# Patient Record
Sex: Female | Born: 1945 | Race: White | Hispanic: No | Smoking: Never smoker
Health system: Southern US, Community
[De-identification: ages and names within clinical notes are randomized; demographics above are authoritative.]

## PROBLEM LIST (undated history)

## (undated) DIAGNOSIS — M1990 Unspecified osteoarthritis, unspecified site: Secondary | ICD-10-CM

## (undated) DIAGNOSIS — I10 Essential (primary) hypertension: Secondary | ICD-10-CM

## (undated) DIAGNOSIS — S060X9A Concussion with loss of consciousness of unspecified duration, initial encounter: Secondary | ICD-10-CM

## (undated) DIAGNOSIS — I5189 Other ill-defined heart diseases: Secondary | ICD-10-CM

## (undated) DIAGNOSIS — F329 Major depressive disorder, single episode, unspecified: Secondary | ICD-10-CM

## (undated) DIAGNOSIS — R739 Hyperglycemia, unspecified: Secondary | ICD-10-CM

## (undated) DIAGNOSIS — F32A Depression, unspecified: Secondary | ICD-10-CM

## (undated) DIAGNOSIS — S060XAA Concussion with loss of consciousness status unknown, initial encounter: Secondary | ICD-10-CM

## (undated) DIAGNOSIS — R5381 Other malaise: Secondary | ICD-10-CM

## (undated) DIAGNOSIS — K5792 Diverticulitis of intestine, part unspecified, without perforation or abscess without bleeding: Secondary | ICD-10-CM

## (undated) DIAGNOSIS — K219 Gastro-esophageal reflux disease without esophagitis: Secondary | ICD-10-CM

## (undated) DIAGNOSIS — R0789 Other chest pain: Secondary | ICD-10-CM

## (undated) DIAGNOSIS — Z8619 Personal history of other infectious and parasitic diseases: Secondary | ICD-10-CM

## (undated) DIAGNOSIS — I499 Cardiac arrhythmia, unspecified: Secondary | ICD-10-CM

## (undated) DIAGNOSIS — E119 Type 2 diabetes mellitus without complications: Secondary | ICD-10-CM

## (undated) HISTORY — DX: Major depressive disorder, single episode, unspecified: F32.9

## (undated) HISTORY — DX: Depression, unspecified: F32.A

## (undated) HISTORY — PX: BREAST SURGERY: SHX581

## (undated) HISTORY — DX: Essential (primary) hypertension: I10

## (undated) HISTORY — DX: Personal history of other infectious and parasitic diseases: Z86.19

## (undated) HISTORY — DX: Hyperglycemia, unspecified: R73.9

## (undated) HISTORY — DX: Other ill-defined heart diseases: I51.89

## (undated) HISTORY — PX: BREAST EXCISIONAL BIOPSY: SUR124

## (undated) HISTORY — PX: TOE SURGERY: SHX1073

## (undated) HISTORY — PX: ROOT CANAL: SHX2363

## (undated) HISTORY — PX: ABDOMINAL HYSTERECTOMY: SHX81

## (undated) HISTORY — PX: CHOLECYSTECTOMY: SHX55

## (undated) HISTORY — DX: Type 2 diabetes mellitus without complications: E11.9

## (undated) HISTORY — DX: Concussion with loss of consciousness of unspecified duration, initial encounter: S06.0X9A

## (undated) HISTORY — DX: Diverticulitis of intestine, part unspecified, without perforation or abscess without bleeding: K57.92

## (undated) HISTORY — PX: CATARACT EXTRACTION, BILATERAL: SHX1313

## (undated) HISTORY — DX: Concussion with loss of consciousness status unknown, initial encounter: S06.0XAA

## (undated) HISTORY — DX: Other chest pain: R07.89

## (undated) HISTORY — DX: Gastro-esophageal reflux disease without esophagitis: K21.9

## (undated) HISTORY — DX: Other malaise: R53.81

---

## 1995-08-20 HISTORY — PX: BLADDER SUSPENSION: SHX72

## 1998-04-19 ENCOUNTER — Other Ambulatory Visit: Admission: RE | Admit: 1998-04-19 | Discharge: 1998-04-19 | Payer: Self-pay | Admitting: *Deleted

## 1998-10-19 ENCOUNTER — Ambulatory Visit (HOSPITAL_BASED_OUTPATIENT_CLINIC_OR_DEPARTMENT_OTHER): Admission: RE | Admit: 1998-10-19 | Discharge: 1998-10-19 | Payer: Self-pay | Admitting: *Deleted

## 1999-08-01 ENCOUNTER — Other Ambulatory Visit: Admission: RE | Admit: 1999-08-01 | Discharge: 1999-08-01 | Payer: Self-pay | Admitting: *Deleted

## 2000-04-28 ENCOUNTER — Encounter: Payer: Self-pay | Admitting: Gastroenterology

## 2000-05-12 ENCOUNTER — Encounter (INDEPENDENT_AMBULATORY_CARE_PROVIDER_SITE_OTHER): Payer: Self-pay | Admitting: Specialist

## 2000-05-12 ENCOUNTER — Encounter: Payer: Self-pay | Admitting: Surgery

## 2000-05-12 ENCOUNTER — Ambulatory Visit (HOSPITAL_COMMUNITY): Admission: RE | Admit: 2000-05-12 | Discharge: 2000-05-13 | Payer: Self-pay | Admitting: Surgery

## 2001-07-07 ENCOUNTER — Other Ambulatory Visit: Admission: RE | Admit: 2001-07-07 | Discharge: 2001-07-07 | Payer: Self-pay | Admitting: *Deleted

## 2001-07-07 ENCOUNTER — Encounter: Admission: RE | Admit: 2001-07-07 | Discharge: 2001-07-07 | Payer: Self-pay

## 2002-07-30 ENCOUNTER — Encounter: Admission: RE | Admit: 2002-07-30 | Discharge: 2002-07-30 | Payer: Self-pay | Admitting: *Deleted

## 2002-11-04 ENCOUNTER — Ambulatory Visit (HOSPITAL_COMMUNITY): Admission: RE | Admit: 2002-11-04 | Discharge: 2002-11-04 | Payer: Self-pay

## 2003-09-27 ENCOUNTER — Other Ambulatory Visit: Admission: RE | Admit: 2003-09-27 | Discharge: 2003-09-27 | Payer: Self-pay | Admitting: *Deleted

## 2004-08-24 ENCOUNTER — Ambulatory Visit (HOSPITAL_COMMUNITY): Admission: RE | Admit: 2004-08-24 | Discharge: 2004-08-24 | Payer: Self-pay | Admitting: *Deleted

## 2004-08-29 ENCOUNTER — Ambulatory Visit: Payer: Self-pay | Admitting: Gastroenterology

## 2004-09-10 ENCOUNTER — Ambulatory Visit: Payer: Self-pay | Admitting: Gastroenterology

## 2004-11-29 ENCOUNTER — Ambulatory Visit: Payer: Self-pay

## 2005-04-18 ENCOUNTER — Ambulatory Visit: Payer: Self-pay | Admitting: Gastroenterology

## 2005-05-21 ENCOUNTER — Ambulatory Visit: Payer: Self-pay | Admitting: Gastroenterology

## 2005-12-17 ENCOUNTER — Encounter: Admission: RE | Admit: 2005-12-17 | Discharge: 2005-12-17 | Payer: Self-pay | Admitting: *Deleted

## 2007-01-22 ENCOUNTER — Ambulatory Visit (HOSPITAL_COMMUNITY): Admission: RE | Admit: 2007-01-22 | Discharge: 2007-01-22 | Payer: Self-pay | Admitting: Internal Medicine

## 2007-05-15 ENCOUNTER — Ambulatory Visit: Payer: Self-pay | Admitting: Internal Medicine

## 2007-12-15 ENCOUNTER — Ambulatory Visit: Payer: Self-pay | Admitting: Gastroenterology

## 2007-12-15 DIAGNOSIS — K589 Irritable bowel syndrome without diarrhea: Secondary | ICD-10-CM | POA: Insufficient documentation

## 2007-12-15 DIAGNOSIS — K2100 Gastro-esophageal reflux disease with esophagitis, without bleeding: Secondary | ICD-10-CM | POA: Insufficient documentation

## 2007-12-15 DIAGNOSIS — R1032 Left lower quadrant pain: Secondary | ICD-10-CM | POA: Insufficient documentation

## 2007-12-15 LAB — CONVERTED CEMR LAB
BUN: 8 mg/dL (ref 6–23)
Calcium: 9.7 mg/dL (ref 8.4–10.5)
Chloride: 104 meq/L (ref 96–112)
Creatinine, Ser: 1 mg/dL (ref 0.4–1.2)
Eosinophils Relative: 2.1 % (ref 0.0–5.0)
GFR calc non Af Amer: 60 mL/min
Glucose, Bld: 152 mg/dL — ABNORMAL HIGH (ref 70–99)
Hemoglobin: 14 g/dL (ref 12.0–15.0)
MCHC: 32.9 g/dL (ref 30.0–36.0)
Monocytes Absolute: 0.3 10*3/uL (ref 0.1–1.0)
Monocytes Relative: 4.7 % (ref 3.0–12.0)
Platelets: 376 10*3/uL (ref 150–400)
RDW: 13 % (ref 11.5–14.6)
Sed Rate: 22 mm/hr (ref 0–22)
TSH: 1.14 microintl units/mL (ref 0.35–5.50)
Total Bilirubin: 0.6 mg/dL (ref 0.3–1.2)
WBC: 6.4 10*3/uL (ref 4.5–10.5)

## 2007-12-21 ENCOUNTER — Telehealth: Payer: Self-pay | Admitting: Gastroenterology

## 2007-12-29 ENCOUNTER — Ambulatory Visit: Payer: Self-pay | Admitting: Gastroenterology

## 2007-12-29 DIAGNOSIS — I10 Essential (primary) hypertension: Secondary | ICD-10-CM | POA: Insufficient documentation

## 2007-12-29 DIAGNOSIS — F4323 Adjustment disorder with mixed anxiety and depressed mood: Secondary | ICD-10-CM | POA: Insufficient documentation

## 2008-01-10 ENCOUNTER — Telehealth: Payer: Self-pay | Admitting: Gastroenterology

## 2008-01-13 ENCOUNTER — Ambulatory Visit: Payer: Self-pay | Admitting: Internal Medicine

## 2008-01-14 ENCOUNTER — Telehealth: Payer: Self-pay | Admitting: Gastroenterology

## 2008-01-15 ENCOUNTER — Ambulatory Visit: Payer: Self-pay | Admitting: Gastroenterology

## 2008-01-15 ENCOUNTER — Encounter: Payer: Self-pay | Admitting: Gastroenterology

## 2008-01-22 ENCOUNTER — Encounter: Payer: Self-pay | Admitting: Gastroenterology

## 2008-01-25 ENCOUNTER — Encounter (INDEPENDENT_AMBULATORY_CARE_PROVIDER_SITE_OTHER): Payer: Self-pay | Admitting: *Deleted

## 2008-01-26 ENCOUNTER — Ambulatory Visit (HOSPITAL_COMMUNITY): Admission: RE | Admit: 2008-01-26 | Discharge: 2008-01-26 | Payer: Self-pay | Admitting: Internal Medicine

## 2008-01-26 ENCOUNTER — Telehealth: Payer: Self-pay | Admitting: Gastroenterology

## 2008-02-02 ENCOUNTER — Telehealth: Payer: Self-pay | Admitting: Gastroenterology

## 2008-04-28 ENCOUNTER — Ambulatory Visit: Payer: Self-pay | Admitting: Internal Medicine

## 2008-06-29 ENCOUNTER — Ambulatory Visit: Payer: Self-pay | Admitting: Internal Medicine

## 2009-02-17 ENCOUNTER — Ambulatory Visit (HOSPITAL_COMMUNITY): Admission: RE | Admit: 2009-02-17 | Discharge: 2009-02-17 | Payer: Self-pay | Admitting: Internal Medicine

## 2009-08-01 ENCOUNTER — Encounter (INDEPENDENT_AMBULATORY_CARE_PROVIDER_SITE_OTHER): Payer: Self-pay | Admitting: *Deleted

## 2009-08-31 ENCOUNTER — Ambulatory Visit: Payer: Self-pay | Admitting: Internal Medicine

## 2010-03-21 LAB — HM COLONOSCOPY

## 2010-04-04 ENCOUNTER — Telehealth: Payer: Self-pay | Admitting: Gastroenterology

## 2010-04-16 ENCOUNTER — Ambulatory Visit (HOSPITAL_COMMUNITY): Admission: RE | Admit: 2010-04-16 | Discharge: 2010-04-16 | Payer: Self-pay | Admitting: Internal Medicine

## 2010-09-20 NOTE — Progress Notes (Signed)
Summary: Colonoscopy recall project  ---- Converted from flag ---- ---- 04/04/2010 8:39 AM, Mardella Layman MD [facility] wrote: colon not needed  ---- 04/03/2010 10:52 AM, Harlow Mares CMA (AAMA) wrote: this is part of the GI recall project  you reviewed the paper chart and said patient was due for recall in 08/2009, but 01/2008 on the pathology you said no follow up needed, is recall needed? ------------------------------

## 2011-03-22 LAB — HM PAP SMEAR: HM PAP: NORMAL

## 2011-03-25 ENCOUNTER — Other Ambulatory Visit (HOSPITAL_COMMUNITY): Payer: Self-pay | Admitting: Internal Medicine

## 2011-03-25 DIAGNOSIS — Z1231 Encounter for screening mammogram for malignant neoplasm of breast: Secondary | ICD-10-CM

## 2011-04-18 ENCOUNTER — Ambulatory Visit (HOSPITAL_COMMUNITY)
Admission: RE | Admit: 2011-04-18 | Discharge: 2011-04-18 | Disposition: A | Discharge status: Home / Self Care | Payer: BC Managed Care – PPO | Source: Ambulatory Visit | Attending: Internal Medicine | Admitting: Internal Medicine

## 2011-04-18 DIAGNOSIS — Z1231 Encounter for screening mammogram for malignant neoplasm of breast: Secondary | ICD-10-CM

## 2011-05-17 ENCOUNTER — Ambulatory Visit (HOSPITAL_COMMUNITY)
Admission: RE | Admit: 2011-05-17 | Discharge: 2011-05-17 | Disposition: A | Discharge status: Home / Self Care | Payer: BC Managed Care – PPO | Source: Ambulatory Visit | Attending: Gastroenterology | Admitting: Gastroenterology

## 2011-05-17 ENCOUNTER — Other Ambulatory Visit (HOSPITAL_COMMUNITY): Payer: Self-pay | Admitting: Gastroenterology

## 2011-05-17 DIAGNOSIS — R109 Unspecified abdominal pain: Secondary | ICD-10-CM | POA: Insufficient documentation

## 2011-05-17 DIAGNOSIS — R142 Eructation: Secondary | ICD-10-CM | POA: Insufficient documentation

## 2011-05-17 DIAGNOSIS — R141 Gas pain: Secondary | ICD-10-CM | POA: Insufficient documentation

## 2011-06-07 ENCOUNTER — Other Ambulatory Visit: Payer: Self-pay | Admitting: Gastroenterology

## 2011-06-07 DIAGNOSIS — R1032 Left lower quadrant pain: Secondary | ICD-10-CM

## 2011-06-10 ENCOUNTER — Ambulatory Visit
Admission: RE | Admit: 2011-06-10 | Discharge: 2011-06-10 | Disposition: A | Discharge status: Home / Self Care | Payer: BC Managed Care – PPO | Source: Ambulatory Visit | Attending: Gastroenterology | Admitting: Gastroenterology

## 2011-06-10 DIAGNOSIS — R1032 Left lower quadrant pain: Secondary | ICD-10-CM

## 2011-06-13 ENCOUNTER — Telehealth (INDEPENDENT_AMBULATORY_CARE_PROVIDER_SITE_OTHER): Payer: Self-pay

## 2011-06-13 NOTE — Telephone Encounter (Signed)
Called Misty Stanley at Dr. Kenna Gilbert office to notify her the patient cancelled her appointment with Dr. Donell Beers on 06/14/11, and did not reschedule.

## 2011-06-14 ENCOUNTER — Encounter (INDEPENDENT_AMBULATORY_CARE_PROVIDER_SITE_OTHER): Payer: BC Managed Care – PPO | Admitting: General Surgery

## 2011-06-14 ENCOUNTER — Encounter (INDEPENDENT_AMBULATORY_CARE_PROVIDER_SITE_OTHER): Payer: Self-pay

## 2012-04-06 ENCOUNTER — Other Ambulatory Visit (HOSPITAL_COMMUNITY): Payer: Self-pay | Admitting: Internal Medicine

## 2012-04-06 DIAGNOSIS — Z1231 Encounter for screening mammogram for malignant neoplasm of breast: Secondary | ICD-10-CM

## 2012-04-24 ENCOUNTER — Ambulatory Visit (HOSPITAL_COMMUNITY)
Admission: RE | Admit: 2012-04-24 | Discharge: 2012-04-24 | Disposition: A | Discharge status: Home / Self Care | Payer: Medicare Other | Source: Ambulatory Visit | Attending: Internal Medicine | Admitting: Internal Medicine

## 2012-04-24 DIAGNOSIS — Z1231 Encounter for screening mammogram for malignant neoplasm of breast: Secondary | ICD-10-CM | POA: Insufficient documentation

## 2012-08-04 ENCOUNTER — Ambulatory Visit: Payer: Self-pay | Admitting: Internal Medicine

## 2012-11-18 ENCOUNTER — Ambulatory Visit: Payer: Self-pay | Admitting: Physician Assistant

## 2013-04-28 ENCOUNTER — Other Ambulatory Visit (HOSPITAL_COMMUNITY): Payer: Self-pay | Admitting: Internal Medicine

## 2013-04-28 DIAGNOSIS — Z1231 Encounter for screening mammogram for malignant neoplasm of breast: Secondary | ICD-10-CM

## 2013-05-05 ENCOUNTER — Ambulatory Visit (HOSPITAL_COMMUNITY)
Admission: RE | Admit: 2013-05-05 | Discharge: 2013-05-05 | Disposition: A | Discharge status: Home / Self Care | Payer: Medicare Other | Source: Ambulatory Visit | Attending: Internal Medicine | Admitting: Internal Medicine

## 2013-05-05 DIAGNOSIS — Z1231 Encounter for screening mammogram for malignant neoplasm of breast: Secondary | ICD-10-CM

## 2014-01-04 ENCOUNTER — Ambulatory Visit: Payer: Self-pay | Admitting: Internal Medicine

## 2014-02-18 LAB — HM MAMMOGRAPHY: HM MAMMO: NORMAL

## 2014-08-31 ENCOUNTER — Encounter: Payer: Self-pay | Admitting: *Deleted

## 2014-09-01 ENCOUNTER — Emergency Department: Payer: Self-pay | Admitting: Emergency Medicine

## 2014-09-01 LAB — BASIC METABOLIC PANEL
ANION GAP: 7 (ref 7–16)
BUN: 14 mg/dL (ref 7–18)
CO2: 30 mmol/L (ref 21–32)
CREATININE: 1.02 mg/dL (ref 0.60–1.30)
Calcium, Total: 9.4 mg/dL (ref 8.5–10.1)
Chloride: 106 mmol/L (ref 98–107)
EGFR (Non-African Amer.): 57 — ABNORMAL LOW
Glucose: 131 mg/dL — ABNORMAL HIGH (ref 65–99)
Osmolality: 287 (ref 275–301)
Potassium: 4 mmol/L (ref 3.5–5.1)
Sodium: 143 mmol/L (ref 136–145)

## 2014-09-01 LAB — PROTIME-INR
INR: 1
Prothrombin Time: 12.6 secs (ref 11.5–14.7)

## 2014-09-01 LAB — PRO B NATRIURETIC PEPTIDE: B-TYPE NATIURETIC PEPTID: 17 pg/mL (ref 0–125)

## 2014-09-01 LAB — TROPONIN I

## 2014-09-01 LAB — CBC
HCT: 42.3 % (ref 35.0–47.0)
HGB: 14.2 g/dL (ref 12.0–16.0)
MCH: 29.6 pg (ref 26.0–34.0)
MCHC: 33.6 g/dL (ref 32.0–36.0)
MCV: 88 fL (ref 80–100)
PLATELETS: 397 10*3/uL (ref 150–440)
RBC: 4.79 10*6/uL (ref 3.80–5.20)
RDW: 14.4 % (ref 11.5–14.5)
WBC: 8.4 10*3/uL (ref 3.6–11.0)

## 2014-09-05 ENCOUNTER — Ambulatory Visit (INDEPENDENT_AMBULATORY_CARE_PROVIDER_SITE_OTHER): Payer: PPO | Admitting: Cardiovascular Disease

## 2014-09-05 ENCOUNTER — Encounter: Payer: Self-pay | Admitting: Cardiovascular Disease

## 2014-09-05 VITALS — BP 148/74 | HR 102 | Ht 59.0 in | Wt 187.5 lb

## 2014-09-05 DIAGNOSIS — R079 Chest pain, unspecified: Secondary | ICD-10-CM

## 2014-09-05 DIAGNOSIS — I10 Essential (primary) hypertension: Secondary | ICD-10-CM

## 2014-09-05 NOTE — Assessment & Plan Note (Signed)
The patient's chest pain has some anginal features and some atypical feature. There could be a component of physical deconditioning causing some of her resting sinus tachycardia and discomfort. Nonetheless, she has risk factors for coronary artery disease and thus I requested evaluation with a treadmill nuclear stress test. Due to significant dyspnea, I requested an echocardiogram as well. This is to evaluate diastolic function and pulmonary pressure. I asked her to cut down on physical activities until after cardiac evaluation.

## 2014-09-05 NOTE — Progress Notes (Signed)
Primary care physician: Dr. Emily Filbert  HPI  This is a pleasant 69 year old female who is here today for evaluation of chest pain and shortness of breath. She has no previous cardiac history. She has no history of diabetes or thyroid disease. She does have known history of hypertension and used to be on bisoprolol hydrochlorothiazide for many years which was discontinued in the summertime as her blood pressure was running normally. She was placed on atenolol which causes constipation and currently she is not taking any medications. Last week while [place] was in the airport she had an episode of substernal chest pain associated with palpitations. She checked her heart rate and it was 107 each per minute. She was anxious at that time. She started having similar chest pain on Thursday but was brief. On Saturday, she had a prolonged episode of substernal chest heaviness and sharp discomfort in her back associated with shortness of breath. It lasted for a few hours. She went to the emergency room at [facility]. Basic workup there was negative labs and chest x-ray. She rested since then and has not had recurrent episodes. She does not exercise on a regular basis. She does not consume any caffeinated products and she is not a smoker. There is no family history of coronary artery disease.  Allergies  Allergen Reactions  . Iohexol Anaphylaxis    Pt blacked out after receiving iv contrast in the past  . Ciprofloxacin   . Codeine   . Levofloxacin   . Nitrofurantoin   . Sulfonamide Derivatives      Current Outpatient Prescriptions on File Prior to Visit  Medication Sig Dispense Refill  . cholecalciferol (VITAMIN D) 1000 UNITS tablet Take 1,000 Units by mouth daily.    Marland Kitchen docusate sodium (COLACE) 100 MG capsule Take 100 mg by mouth daily.    . furosemide (LASIX) 20 MG tablet Take 20 mg by mouth as needed.     Marland Kitchen omeprazole (PRILOSEC) 40 MG capsule Take 40 mg by mouth daily.    Marland Kitchen saccharomyces boulardii (FLORASTOR)  250 MG capsule Take 250 mg by mouth daily.    Marland Kitchen ALPRAZolam (XANAX) 0.5 MG tablet Take 0.5 mg by mouth at bedtime as needed for anxiety.     No current facility-administered medications on file prior to visit.     Past Medical History  Diagnosis Date  . Hypertension   . Hyperglycemia   . Diverticulitis      Past Surgical History  Procedure Laterality Date  . Bladder suspension    . Cholecystectomy    . Abdominal hysterectomy       Family History  Problem Relation Age of Onset  . Family history unknown: Yes     History   Social History  . Marital Status: Married    Spouse Name: N/A    Number of Children: N/A  . Years of Education: N/A   Occupational History  . Not on file.   Social History Main Topics  . Smoking status: Never Smoker   . Smokeless tobacco: Not on file  . Alcohol Use: No  . Drug Use: No  . Sexual Activity: Not on file   Other Topics Concern  . Not on file   Social History Narrative     ROS A 10 point review of system was performed. It is negative other than that mentioned in the history of present illness.   PHYSICAL EXAM   BP 148/74 mmHg  Pulse 102  Ht 4\' 11"  (1.499 m)  Wt 187 lb 8 oz (85.049 kg)  BMI 37.85 kg/m2 Constitutional: She is oriented to person, place, and time. She appears well-developed and well-nourished. No distress.  HENT: No nasal discharge.  Head: Normocephalic and atraumatic.  Eyes: Pupils are equal and round. No discharge.  Neck: Normal range of motion. Neck supple. No JVD present. No thyromegaly present.  Cardiovascular: Normal rate, regular rhythm, normal heart sounds. Exam reveals no gallop and no friction rub. No murmur heard.  Pulmonary/Chest: Effort normal and breath sounds normal. No stridor. No respiratory distress. She has no wheezes. She has no rales. She exhibits no tenderness.  Abdominal: Soft. Bowel sounds are normal. She exhibits no distension. There is no tenderness. There is no rebound and no  guarding.  Musculoskeletal: Normal range of motion. She exhibits no edema and no tenderness.  Neurological: She is alert and oriented to person, place, and time. Coordination normal.  Skin: Skin is warm and dry. No rash noted. She is not diaphoretic. No erythema. No pallor.  Psychiatric: She has a normal mood and affect. Her behavior is normal. Judgment and thought content normal.     [id number]  Tachycardia  -Prominent R(V1) -nonspecific.   Low voltage -possible pulmonary disease.   ABNORMAL     ASSESSMENT AND PLAN

## 2014-09-05 NOTE — Assessment & Plan Note (Signed)
If blood pressure remains elevated, I would consider treatment with a beta blocker and starting an exercise program to improve conditioning.

## 2014-09-05 NOTE — Patient Instructions (Signed)
[place]  Your caregiver has ordered a Stress Test with nuclear imaging. The purpose of this test is to evaluate the blood supply to your heart muscle. This procedure is referred to as a "Non-Invasive Stress Test." This is because other than having an IV started in your vein, nothing is inserted or "invades" your body. Cardiac stress tests are done to find areas of poor blood flow to the heart by determining the extent of coronary artery disease (CAD). Some patients exercise on a treadmill, which naturally increases the blood flow to your heart, while others who are  unable to walk on a treadmill due to physical limitations have a pharmacologic/chemical stress agent called Lexiscan . This medicine will mimic walking on a treadmill by temporarily increasing your coronary blood flow.   Please note: these test may take anywhere between 2-4 hours to complete  PLEASE REPORT TO [facility] AT THE FIRST DESK WILL DIRECT YOU WHERE TO GO  Date of Procedure:_______01/20/16______________________________  Arrival Time for Procedure:________0945 am______________________    PLEASE NOTIFY THE OFFICE AT LEAST 24 HOURS IN ADVANCE IF YOU ARE UNABLE TO KEEP YOUR APPOINTMENT.  [phone] AND  PLEASE NOTIFY NUCLEAR MEDICINE AT [facility] AT LEAST 24 HOURS IN ADVANCE IF YOU ARE UNABLE TO KEEP YOUR APPOINTMENT. [phone]  How to prepare for your Myoview test:  1. Do not eat or drink after midnight 2. No caffeine for 24 hours prior to test 3. No smoking 24 hours prior to test. 4. Your medication may be taken with water.  If your doctor stopped a medication because of this test, do not take that medication. 5. Ladies, please do not wear dresses.  Skirts or pants are appropriate. Please wear a short sleeve shirt. 6. No perfume, cologne or lotion. 7. Wear comfortable walking shoes. No heels!       Your physician has requested that you have an echocardiogram. Echocardiography is  a painless test that uses sound waves to create images of your heart. It provides your doctor with information about the size and shape of your heart and how well your heart's chambers and valves are working. This procedure takes approximately one hour. There are no restrictions for this procedure.  Your physician recommends that you schedule a follow-up appointment in:  After you tests

## 2014-09-07 ENCOUNTER — Other Ambulatory Visit: Payer: Self-pay

## 2014-09-07 ENCOUNTER — Telehealth: Payer: Self-pay

## 2014-09-07 ENCOUNTER — Ambulatory Visit: Payer: Self-pay | Admitting: Cardiovascular Disease

## 2014-09-07 DIAGNOSIS — R079 Chest pain, unspecified: Secondary | ICD-10-CM

## 2014-09-07 NOTE — Telephone Encounter (Signed)
Pt husband call, states pt had stress test this a.m., and they are headed to the beach (today) and would like to know if this pt is ok to travel. Please advise, asap, as they would like to leave today.

## 2014-09-07 NOTE — Telephone Encounter (Signed)
I returned call to patient  Informed her that stress test results were not in yet  She stated she went ahead and left for the beach  I informed patient that I will call with results as soon as they are available  Instructed patient to go to local ED if her situation became emergent  Patient verbalized understanding

## 2014-09-08 ENCOUNTER — Ambulatory Visit: Payer: Self-pay | Admitting: Cardiovascular Disease

## 2014-09-08 ENCOUNTER — Telehealth: Payer: Self-pay

## 2014-09-08 NOTE — Telephone Encounter (Signed)
Informed patient per Dr. Fletcher Anon Her stress test is normal

## 2014-09-08 NOTE — Telephone Encounter (Signed)
Pt would like stress test results. Please call. 

## 2014-09-08 NOTE — Telephone Encounter (Signed)
Normal

## 2014-09-12 ENCOUNTER — Other Ambulatory Visit: Payer: Self-pay

## 2014-09-12 ENCOUNTER — Other Ambulatory Visit (INDEPENDENT_AMBULATORY_CARE_PROVIDER_SITE_OTHER): Payer: PPO

## 2014-09-12 DIAGNOSIS — R9431 Abnormal electrocardiogram [ECG] [EKG]: Secondary | ICD-10-CM

## 2014-09-12 DIAGNOSIS — R079 Chest pain, unspecified: Secondary | ICD-10-CM

## 2014-09-14 ENCOUNTER — Telehealth: Payer: Self-pay | Admitting: *Deleted

## 2014-09-14 MED ORDER — METOPROLOL TARTRATE 25 MG PO TABS: 25.0000 mg | ORAL_TABLET | Freq: Two times a day (BID) | ORAL | Status: DC

## 2014-09-14 NOTE — Telephone Encounter (Signed)
-----   Message from Wellington Hampshire, MD sent at 09/13/2014  7:35 AM EST ----- Inform patient that echo was fine. Normal EF with mild diastolic dysfunction .

## 2014-09-14 NOTE — Telephone Encounter (Signed)
Reviewed results with patient.  Patient stated she is still short of breath and her heart is racing  Her blood pressure has been elevated  She stated she thought now that Dr. Fletcher Anon has ruled out any major issues with her heart that he would start her on something to help with her heart rate and blood pressure  She would like to start something right away because the symptoms are so uncomfortable

## 2014-09-14 NOTE — Telephone Encounter (Signed)
Informed patient of Dr. Jacklynn Ganong instructions  Scheduled patient for follow up  Patient verbalized understanding

## 2014-09-14 NOTE — Telephone Encounter (Signed)
Start Metoprolol 25 mg bid.  Follow up in few weeks.

## 2014-09-30 ENCOUNTER — Ambulatory Visit (INDEPENDENT_AMBULATORY_CARE_PROVIDER_SITE_OTHER): Payer: PPO | Admitting: Cardiovascular Disease

## 2014-09-30 ENCOUNTER — Encounter: Payer: Self-pay | Admitting: Cardiovascular Disease

## 2014-09-30 VITALS — BP 120/78 | HR 74 | Ht 59.5 in | Wt 192.0 lb

## 2014-09-30 DIAGNOSIS — R0789 Other chest pain: Secondary | ICD-10-CM

## 2014-09-30 DIAGNOSIS — R000 Tachycardia, unspecified: Secondary | ICD-10-CM

## 2014-09-30 DIAGNOSIS — I10 Essential (primary) hypertension: Secondary | ICD-10-CM

## 2014-09-30 NOTE — Patient Instructions (Signed)
Continue same medications.   Your physician wants you to follow-up in: 6 months.  You will receive a reminder letter in the mail two months in advance. If you don't receive a letter, please call our office to schedule the follow-up appointment.  

## 2014-09-30 NOTE — Assessment & Plan Note (Signed)
She mostly has exertional dyspnea with occasional chest discomfort. Cardiac workup so far has been negative. There is evidence of physical deconditioning which is likely contributing. She was started on metoprolol 25 mg twice daily with improvement in symptoms. I advised her to start an exercise program.

## 2014-09-30 NOTE — Progress Notes (Signed)
Primary care physician: Dr. Emily Filbert  HPI  This is a pleasant 69 year old female who is here today for follow-up visit regarding  chest pain and shortness of breath. She has no previous cardiac history. She has no history of diabetes or thyroid disease. She does have known history of hypertension and used to be on bisoprolol hydrochlorothiazide for many years which was discontinued in the summertime as her blood pressure was running normally. She was seen recently for substernal chest pain or palpitations. She checked her heart rate and it was 107 each per minute. She was anxious at that time.  She underwent a treadmill nuclear stress test which showed no evidence of ischemia with normal ejection fraction. Echocardiogram was overall unremarkable except for mild diastolic dysfunction.  Given exaggerated heart rate response to exercise and elevated blood pressure, I started her on metoprolol 25 mg twice daily. She reports some improvement in symptoms.  Allergies  Allergen Reactions  . Iohexol Anaphylaxis    Pt blacked out after receiving iv contrast in the past  . Ciprofloxacin   . Codeine   . Levofloxacin   . Nitrofurantoin   . Sulfonamide Derivatives      Current Outpatient Prescriptions on File Prior to Visit  Medication Sig Dispense Refill  . ALPRAZolam (XANAX) 0.5 MG tablet Take 0.5 mg by mouth at bedtime as needed for anxiety.    Marland Kitchen aspirin 81 MG tablet Take 81 mg by mouth daily.    . cholecalciferol (VITAMIN D) 1000 UNITS tablet Take 1,000 Units by mouth daily.    Marland Kitchen docusate sodium (COLACE) 100 MG capsule Take 100 mg by mouth daily.    . furosemide (LASIX) 20 MG tablet Take 20 mg by mouth as needed.     . metoprolol tartrate (LOPRESSOR) 25 MG tablet Take 1 tablet (25 mg total) by mouth 2 (two) times daily. 60 tablet 6  . omeprazole (PRILOSEC) 40 MG capsule Take 40 mg by mouth daily.    Marland Kitchen saccharomyces boulardii (FLORASTOR) 250 MG capsule Take 250 mg by mouth daily.     No  current facility-administered medications on file prior to visit.     Past Medical History  Diagnosis Date  . Hypertension   . Hyperglycemia   . Diverticulitis      Past Surgical History  Procedure Laterality Date  . Bladder suspension    . Cholecystectomy    . Abdominal hysterectomy       Family History  Problem Relation Age of Onset  . Family history unknown: Yes     History   Social History  . Marital Status: Married    Spouse Name: N/A  . Number of Children: N/A  . Years of Education: N/A   Occupational History  . Not on file.   Social History Main Topics  . Smoking status: Never Smoker   . Smokeless tobacco: Not on file  . Alcohol Use: No  . Drug Use: No  . Sexual Activity: Not on file   Other Topics Concern  . Not on file   Social History Narrative     ROS A 10 point review of system was performed. It is negative other than that mentioned in the history of present illness.   PHYSICAL EXAM   BP 120/78 mmHg  Pulse 74  Ht 4' 11.5" (1.511 m)  Wt 192 lb (87.091 kg)  BMI 38.15 kg/m2 Constitutional: She is oriented to person, place, and time. She appears well-developed and well-nourished. No distress.  HENT: No  nasal discharge.  Head: Normocephalic and atraumatic.  Eyes: Pupils are equal and round. No discharge.  Neck: Normal range of motion. Neck supple. No JVD present. No thyromegaly present.  Cardiovascular: Normal rate, regular rhythm, normal heart sounds. Exam reveals no gallop and no friction rub. No murmur heard.  Pulmonary/Chest: Effort normal and breath sounds normal. No stridor. No respiratory distress. She has no wheezes. She has no rales. She exhibits no tenderness.  Abdominal: Soft. Bowel sounds are normal. She exhibits no distension. There is no tenderness. There is no rebound and no guarding.  Musculoskeletal: Normal range of motion. She exhibits no edema and no tenderness.  Neurological: She is alert and oriented to person, place,  and time. Coordination normal.  Skin: Skin is warm and dry. No rash noted. She is not diaphoretic. No erythema. No pallor.  Psychiatric: She has a normal mood and affect. Her behavior is normal. Judgment and thought content normal.     [id number]  Rhythm  Low voltage in precordial leads.   ABNORMAL    ASSESSMENT AND PLAN

## 2014-09-30 NOTE — Assessment & Plan Note (Signed)
Blood pressure improved to normal after the addition of metoprolol 25 mg twice daily.

## 2014-11-29 ENCOUNTER — Telehealth: Payer: Self-pay | Admitting: *Deleted

## 2014-11-29 NOTE — Telephone Encounter (Signed)
Spoke w/ pt.  She reports that she was out of town this weekend, did a lot of walking and eating out, as well as "enojoying a few margaritas".  Pt has not sat down since getting home and has continued her usual routine.  She does not own compression hose.  Advised pt to obtain compression hose, try to keep her feet elevated and resume her low sodium diet.  She verbalizes understanding and will call back if her sx do not resolve.

## 2014-11-29 NOTE — Telephone Encounter (Signed)
Pt c/o swelling: STAT is pt has developed SOB within 24 hours  1. How long have you been experiencing swelling? Few days  2. Where is the swelling located? ankles   3.  Are you currently taking a "fluid pill"? Asking about this. She is not sure if she can do this. Not sure about when to take them.   4.  Are you currently SOB? No, was this weekend bc of walking   5.  Have you traveled recently? Yes, [place]. Did a lot of walking.

## 2015-01-11 DIAGNOSIS — E119 Type 2 diabetes mellitus without complications: Secondary | ICD-10-CM | POA: Insufficient documentation

## 2015-01-24 ENCOUNTER — Ambulatory Visit (INDEPENDENT_AMBULATORY_CARE_PROVIDER_SITE_OTHER): Payer: PPO | Admitting: Cardiovascular Disease

## 2015-01-24 ENCOUNTER — Encounter: Payer: Self-pay | Admitting: Cardiovascular Disease

## 2015-01-24 VITALS — BP 138/80 | HR 82 | Ht 59.0 in | Wt 193.2 lb

## 2015-01-24 DIAGNOSIS — I10 Essential (primary) hypertension: Secondary | ICD-10-CM

## 2015-01-24 DIAGNOSIS — R0789 Other chest pain: Secondary | ICD-10-CM

## 2015-01-24 DIAGNOSIS — R000 Tachycardia, unspecified: Secondary | ICD-10-CM

## 2015-01-24 DIAGNOSIS — M62838 Other muscle spasm: Secondary | ICD-10-CM

## 2015-01-24 NOTE — Assessment & Plan Note (Signed)
The exact etiology of this is not clear. Recent labs were unremarkable including normal thyroid function. She has no carotid bruits and distal pulses are normal. Thus, I assured her that there is no evidence of carotid stenosis or peripheral arterial disease. If symptoms persist, I asked him to follow-up with Dr. Sabra Heck and consider checking CPK and sedimentation rate. The symptoms started after a recent trip to [place] but she is not aware of any bug bites or any other exposures.

## 2015-01-24 NOTE — Patient Instructions (Signed)
Medication Instructions:  Your physician recommends that you continue on your current medications as directed. Please refer to the Current Medication list given to you today.   Labwork: none  Testing/Procedures: none  Follow-Up: Your physician wants you to follow-up in: six months with Dr. Fletcher Anon.  You will receive a reminder letter in the mail two months in advance. If you don't receive a letter, please call our office to schedule the follow-up appointment.   Any Other Special Instructions Will Be Listed Below (If Applicable).

## 2015-01-24 NOTE — Progress Notes (Signed)
Primary care physician: Dr. Emily Filbert  HPI  This is a pleasant 69 year old female who is here today for follow-up visit regarding  chest pain and shortness of breath. She has no previous cardiac history. She has no history of diabetes or thyroid disease. She does have known history of hypertension and used to be on bisoprolol hydrochlorothiazide for many years which was discontinued in the summertime as her blood pressure was running normally. She was seen recently for substernal chest pain or palpitations. She checked her heart rate and it was 107 each per minute. She was anxious at that time.  A treadmill nuclear stress test in January 2016  showed no evidence of ischemia with normal ejection fraction. Echocardiogram was overall unremarkable except for mild diastolic dysfunction.   Given exaggerated heart rate response to exercise and elevated blood pressure, I started her on metoprolol 25 mg twice daily.   She went on a cruise recently to [place] with her husband. Since then, she had intermittent episodes of muscle spasm which are generalized including neck, upper and lower extremities. These usually last for a few seconds and improved with changing position. She saw Dr. Sabra Heck who obtained routine labs which were overall unremarkable including normal thyroid function. She was started on magnesium supplement but reports no significant improvement. The neck spasms made her worry about possible carotid artery stenosis.  Allergies  Allergen Reactions  . Iohexol Anaphylaxis    Pt blacked out after receiving iv contrast in the past  . Ciprofloxacin   . Codeine   . Levofloxacin   . Nitrofurantoin   . Sulfonamide Derivatives      Current Outpatient Prescriptions on File Prior to Visit  Medication Sig Dispense Refill  . ALPRAZolam (XANAX) 0.5 MG tablet Take 0.5 mg by mouth at bedtime as needed for anxiety.    Marland Kitchen aspirin 81 MG tablet Take 81 mg by mouth daily.    . cholecalciferol (VITAMIN D)  1000 UNITS tablet Take 1,000 Units by mouth daily.    Marland Kitchen docusate sodium (COLACE) 100 MG capsule Take 100 mg by mouth daily.    . furosemide (LASIX) 20 MG tablet Take 20 mg by mouth as needed.     . metoprolol tartrate (LOPRESSOR) 25 MG tablet Take 1 tablet (25 mg total) by mouth 2 (two) times daily. (Patient taking differently: Take 25 mg by mouth daily. ) 60 tablet 6  . omeprazole (PRILOSEC) 40 MG capsule Take 40 mg by mouth daily.    Marland Kitchen saccharomyces boulardii (FLORASTOR) 250 MG capsule Take 250 mg by mouth daily.     No current facility-administered medications on file prior to visit.     Past Medical History  Diagnosis Date  . Hypertension   . Hyperglycemia   . Diverticulitis      Past Surgical History  Procedure Laterality Date  . Bladder suspension    . Cholecystectomy    . Abdominal hysterectomy       Family History  Problem Relation Age of Onset  . Family history unknown: Yes     History   Social History  . Marital Status: Married    Spouse Name: N/A  . Number of Children: N/A  . Years of Education: N/A   Occupational History  . Not on file.   Social History Main Topics  . Smoking status: Never Smoker   . Smokeless tobacco: Not on file  . Alcohol Use: No  . Drug Use: No  . Sexual Activity: Not on file  Other Topics Concern  . Not on file   Social History Narrative     ROS A 10 point review of system was performed. It is negative other than that mentioned in the history of present illness.   PHYSICAL EXAM   BP 138/80 mmHg  Pulse 82  Ht 4\' 11"  (1.499 m)  Wt 193 lb 4 oz (87.658 kg)  BMI 39.01 kg/m2 Constitutional: She is oriented to person, place, and time. She appears well-developed and well-nourished. No distress.  HENT: No nasal discharge.  Head: Normocephalic and atraumatic.  Eyes: Pupils are equal and round. No discharge.  Neck: Normal range of motion. Neck supple. No JVD present. No thyromegaly present. No carotid  bruits Cardiovascular: Normal rate, regular rhythm, normal heart sounds. Exam reveals no gallop and no friction rub. No murmur heard.  Pulmonary/Chest: Effort normal and breath sounds normal. No stridor. No respiratory distress. She has no wheezes. She has no rales. She exhibits no tenderness.  Abdominal: Soft. Bowel sounds are normal. She exhibits no distension. There is no tenderness. There is no rebound and no guarding.  Musculoskeletal: Normal range of motion. She exhibits no edema and no tenderness.  Neurological: She is alert and oriented to person, place, and time. Coordination normal.  Skin: Skin is warm and dry. No rash noted. She is not diaphoretic. No erythema. No pallor.  Psychiatric: She has a normal mood and affect. Her behavior is normal. Judgment and thought content normal.  Normal distal pulses.   [id number]  Rhythm  Low voltage in precordial leads.   -Poor R-wave progression -may be secondary to pulmonary disease   consider old anterior infarct.   ABNORMAL    ASSESSMENT AND PLAN

## 2015-01-24 NOTE — Assessment & Plan Note (Signed)
Blood pressure improved on metoprolol.

## 2015-01-24 NOTE — Assessment & Plan Note (Signed)
No further episodes of this.

## 2015-02-10 LAB — HM DIABETES EYE EXAM

## 2015-03-22 ENCOUNTER — Encounter (INDEPENDENT_AMBULATORY_CARE_PROVIDER_SITE_OTHER): Payer: Self-pay

## 2015-03-22 ENCOUNTER — Encounter: Payer: Self-pay | Admitting: Internal Medicine

## 2015-03-22 ENCOUNTER — Ambulatory Visit (INDEPENDENT_AMBULATORY_CARE_PROVIDER_SITE_OTHER): Payer: PPO | Admitting: Internal Medicine

## 2015-03-22 ENCOUNTER — Encounter: Payer: Self-pay | Admitting: *Deleted

## 2015-03-22 VITALS — BP 122/74 | HR 71 | Temp 98.1°F | Ht 59.25 in | Wt 195.1 lb

## 2015-03-22 DIAGNOSIS — Z1239 Encounter for other screening for malignant neoplasm of breast: Secondary | ICD-10-CM | POA: Diagnosis not present

## 2015-03-22 DIAGNOSIS — E119 Type 2 diabetes mellitus without complications: Secondary | ICD-10-CM

## 2015-03-22 DIAGNOSIS — K5792 Diverticulitis of intestine, part unspecified, without perforation or abscess without bleeding: Secondary | ICD-10-CM | POA: Insufficient documentation

## 2015-03-22 DIAGNOSIS — E669 Obesity, unspecified: Secondary | ICD-10-CM | POA: Insufficient documentation

## 2015-03-22 DIAGNOSIS — K589 Irritable bowel syndrome without diarrhea: Secondary | ICD-10-CM | POA: Diagnosis not present

## 2015-03-22 DIAGNOSIS — I10 Essential (primary) hypertension: Secondary | ICD-10-CM

## 2015-03-22 DIAGNOSIS — F4323 Adjustment disorder with mixed anxiety and depressed mood: Secondary | ICD-10-CM

## 2015-03-22 MED ORDER — BUPROPION HCL ER (XL) 150 MG PO TB24: 150.0000 mg | ORAL_TABLET | Freq: Every day | ORAL | Status: DC

## 2015-03-22 NOTE — Assessment & Plan Note (Signed)
Symptoms well controlled. Some intermittent rectal bleeding from chronic hemorrhoids. Followed by Dr. Collene Mares in GI.

## 2015-03-22 NOTE — Assessment & Plan Note (Signed)
BP Readings from Last 3 Encounters:  03/22/15 122/74  01/24/15 138/80  09/30/14 120/78   BP well controlled on Metoprolol. Renal function with labs.

## 2015-03-22 NOTE — Assessment & Plan Note (Signed)
Will check A1c with labs. Discussed potentially starting medication to help both control blood sugars and help with appetite. Eye exam and foot exam UTD.

## 2015-03-22 NOTE — Assessment & Plan Note (Signed)
Recent worsening symptoms of depressed mood. Will try adding Wellbutrin to help with both mood and with appetite suppression.

## 2015-03-22 NOTE — Assessment & Plan Note (Signed)
Wt Readings from Last 3 Encounters:  03/22/15 195 lb 2 oz (88.508 kg)  01/24/15 193 lb 4 oz (87.658 kg)  09/30/14 192 lb (87.091 kg)   Body mass index is 39.08 kg/(m^2). Encouraged healthy diet and exercise. Encouraged her to limit carbohydrate intake.  Encouraged her to keep food diary and bring to next visit.

## 2015-03-22 NOTE — Progress Notes (Signed)
Pre visit review using our clinic review tool, if applicable. No additional management support is needed unless otherwise documented below in the visit note. 

## 2015-03-22 NOTE — Patient Instructions (Addendum)
It was nice to meet you!  Start Wellbutrin 150mg  daily in the morning to help with appetite.  Keep food diary over the next few weeks. Limit carbohydrate intake to 35gm per meal.  Set goal of exercise 72min 5 days per week.  Fasting labs next week.

## 2015-03-22 NOTE — Progress Notes (Signed)
Subjective:    Patient ID: Nicole Garcia, female    DOB: [birth year], 69 y.o.   MRN: [id number]  HPI  69YO female presents to establish care.  DM - Mostly low 100s blood sugars. Last A1c 6.6% in 12/2014. Unable to tolerate Metformin because of diarrhea.  Frustrated by inability to lose weight. No exercise. Trying to limit sweets. No snacks.  Wt Readings from Last 3 Encounters:  03/22/15 195 lb 2 oz (88.508 kg)  01/24/15 193 lb 4 oz (87.658 kg)  09/30/14 192 lb (87.091 kg)   Tried to come down on Metoprolol, but had palpitations. Followed by Dr. Fletcher Anon. HR typically in the 80s.  Recently completed toenail surgery with Dr. Vickki Muff.  Depression - Recently feels more down on herself. Attributes this to weight gain. Taking Alprazolam 0.5mg  bid with some improvement. However, would like to get off the Alprazolam because of recent tongue movements she has noted.  Edema - takes Lasix only on occasion.  Having some chronic left leg pain. Had cortisone shot with no improvement.  Has chronic blood in stool. Followed by GI. Diagnosed with hemorrhoids  Past medical, surgical, family and social history per today's encounter.   Review of Systems  Constitutional: Negative for fever, chills, appetite change, fatigue and unexpected weight change.  Eyes: Negative for visual disturbance.  Respiratory: Negative for shortness of breath.   Cardiovascular: Negative for chest pain and leg swelling.  Gastrointestinal: Positive for blood in stool and anal bleeding. Negative for nausea, vomiting, abdominal pain, diarrhea, constipation and rectal pain.  Musculoskeletal: Positive for myalgias and arthralgias.  Skin: Negative for color change and rash.  Hematological: Negative for adenopathy. Does not bruise/bleed easily.  Psychiatric/Behavioral: Positive for dysphoric mood. Negative for sleep disturbance. The patient is nervous/anxious.        Objective:    BP 122/74 mmHg  Pulse 71  Temp(Src)  98.1 F (36.7 C) (Oral)  Ht 4' 11.25" (1.505 m)  Wt 195 lb 2 oz (88.508 kg)  BMI 39.08 kg/m2  SpO2 97% Physical Exam  Constitutional: She is oriented to person, place, and time. She appears well-developed and well-nourished. No distress.  HENT:  Head: Normocephalic and atraumatic.  Right Ear: External ear normal.  Left Ear: External ear normal.  Nose: Nose normal.  Mouth/Throat: Oropharynx is clear and moist. No oropharyngeal exudate.  Eyes: Conjunctivae are normal. Pupils are equal, round, and reactive to light. Right eye exhibits no discharge. Left eye exhibits no discharge. No scleral icterus.  Neck: Normal range of motion. Neck supple. No tracheal deviation present. No thyromegaly present.  Cardiovascular: Normal rate, regular rhythm, normal heart sounds and intact distal pulses.  Exam reveals no gallop and no friction rub.   No murmur heard. Pulmonary/Chest: Effort normal and breath sounds normal. No respiratory distress. She has no wheezes. She has no rales. She exhibits no tenderness.  Abdominal: Soft. Bowel sounds are normal. She exhibits no distension and no mass. There is no tenderness. There is no rebound and no guarding.  Musculoskeletal: Normal range of motion. She exhibits no edema or tenderness.  Lymphadenopathy:    She has no cervical adenopathy.  Neurological: She is alert and oriented to person, place, and time. No cranial nerve deficit. She exhibits normal muscle tone. Coordination normal.  Skin: Skin is warm and dry. No rash noted. She is not diaphoretic. No erythema. No pallor.  Psychiatric: Her speech is normal and behavior is normal. Judgment and thought content normal. Her mood appears anxious. Cognition  and memory are normal. She exhibits a depressed mood.          Assessment & Plan:  Over 74min of which >50% spent in face-to-face contact with patient reviewing her medical history, multiple concerns and discussing plan of care  Problem List Items Addressed  This Visit      Unprioritized   Adjustment disorder with mixed anxiety and depressed mood    Recent worsening symptoms of depressed mood. Will try adding Wellbutrin to help with both mood and with appetite suppression.      Diabetes mellitus, type 2 - Primary    Will check A1c with labs. Discussed potentially starting medication to help both control blood sugars and help with appetite. Eye exam and foot exam UTD.      Relevant Orders   Comprehensive metabolic panel   Hemoglobin A1c   Lipid panel   TSH   Essential hypertension    BP Readings from Last 3 Encounters:  03/22/15 122/74  01/24/15 138/80  09/30/14 120/78   BP well controlled on Metoprolol. Renal function with labs.      IRRITABLE BOWEL SYNDROME    Symptoms well controlled. Some intermittent rectal bleeding from chronic hemorrhoids. Followed by Dr. Collene Mares in GI.      Obesity (BMI 30-39.9)    Wt Readings from Last 3 Encounters:  03/22/15 195 lb 2 oz (88.508 kg)  01/24/15 193 lb 4 oz (87.658 kg)  09/30/14 192 lb (87.091 kg)   Body mass index is 39.08 kg/(m^2). Encouraged healthy diet and exercise. Encouraged her to limit carbohydrate intake.  Encouraged her to keep food diary and bring to next visit.       Screening for breast cancer    Mammogram ordered.      Relevant Orders   MM Digital Screening       Return in about 2 weeks (around 04/05/2015) for Recheck.

## 2015-03-22 NOTE — Assessment & Plan Note (Signed)
Mammogram ordered

## 2015-03-31 ENCOUNTER — Other Ambulatory Visit (INDEPENDENT_AMBULATORY_CARE_PROVIDER_SITE_OTHER): Payer: PPO

## 2015-03-31 DIAGNOSIS — E119 Type 2 diabetes mellitus without complications: Secondary | ICD-10-CM

## 2015-03-31 LAB — COMPREHENSIVE METABOLIC PANEL
ALBUMIN: 4.4 g/dL (ref 3.5–5.2)
ALT: 17 U/L (ref 0–35)
AST: 20 U/L (ref 0–37)
Alkaline Phosphatase: 86 U/L (ref 39–117)
BUN: 13 mg/dL (ref 6–23)
CALCIUM: 9.9 mg/dL (ref 8.4–10.5)
CHLORIDE: 105 meq/L (ref 96–112)
CO2: 26 mEq/L (ref 19–32)
Creatinine, Ser: 1.06 mg/dL (ref 0.40–1.20)
GFR: 54.8 mL/min — ABNORMAL LOW (ref >60.00)
GLUCOSE: 145 mg/dL — AB (ref 70–99)
POTASSIUM: 4.8 meq/L (ref 3.5–5.1)
SODIUM: 142 meq/L (ref 135–145)
Total Bilirubin: 0.5 mg/dL (ref 0.2–1.2)
Total Protein: 7.9 g/dL (ref 6.0–8.3)

## 2015-03-31 LAB — LIPID PANEL
CHOLESTEROL: 207 mg/dL — AB (ref 0–200)
HDL: 37.7 mg/dL — ABNORMAL LOW (ref >39.00)
NONHDL: 168.82
TRIGLYCERIDES: 383 mg/dL — AB (ref 0.0–149.0)
Total CHOL/HDL Ratio: 5
VLDL: 76.6 mg/dL — ABNORMAL HIGH (ref 0.0–40.0)

## 2015-03-31 LAB — LDL CHOLESTEROL, DIRECT: LDL DIRECT: 101 mg/dL

## 2015-03-31 LAB — TSH: TSH: 2.24 u[IU]/mL (ref 0.35–4.50)

## 2015-03-31 LAB — HEMOGLOBIN A1C: HEMOGLOBIN A1C: 6.7 % — AB (ref 4.6–6.5)

## 2015-04-06 ENCOUNTER — Telehealth: Payer: Self-pay | Admitting: Internal Medicine

## 2015-04-06 ENCOUNTER — Encounter: Payer: Self-pay | Admitting: Internal Medicine

## 2015-04-06 ENCOUNTER — Ambulatory Visit (INDEPENDENT_AMBULATORY_CARE_PROVIDER_SITE_OTHER): Payer: PPO | Admitting: Internal Medicine

## 2015-04-06 VITALS — BP 118/75 | HR 71 | Temp 98.0°F | Ht 59.25 in | Wt 196.1 lb

## 2015-04-06 DIAGNOSIS — F4323 Adjustment disorder with mixed anxiety and depressed mood: Secondary | ICD-10-CM | POA: Diagnosis not present

## 2015-04-06 DIAGNOSIS — E119 Type 2 diabetes mellitus without complications: Secondary | ICD-10-CM

## 2015-04-06 DIAGNOSIS — I10 Essential (primary) hypertension: Secondary | ICD-10-CM | POA: Diagnosis not present

## 2015-04-06 DIAGNOSIS — N289 Disorder of kidney and ureter, unspecified: Secondary | ICD-10-CM | POA: Insufficient documentation

## 2015-04-06 DIAGNOSIS — L03039 Cellulitis of unspecified toe: Secondary | ICD-10-CM | POA: Insufficient documentation

## 2015-04-06 MED ORDER — FLUCONAZOLE 150 MG PO TABS: 150.0000 mg | ORAL_TABLET | ORAL | Status: DC

## 2015-04-06 MED ORDER — OMEPRAZOLE 40 MG PO CPDR: 40.0000 mg | DELAYED_RELEASE_CAPSULE | Freq: Every day | ORAL | Status: DC

## 2015-04-06 NOTE — Assessment & Plan Note (Signed)
Recent BG control excellent. Will plan to repeat A1c in 3 months.

## 2015-04-06 NOTE — Assessment & Plan Note (Signed)
Encouraged her to continue Keflex. Follow up with podiatry as scheduled or sooner if pain or redness is worsening.

## 2015-04-06 NOTE — Telephone Encounter (Signed)
Pt request to have an rx for cortisone cream to put in the corners of mouth. Please advise pt.msn

## 2015-04-06 NOTE — Telephone Encounter (Signed)
Please advise 

## 2015-04-06 NOTE — Telephone Encounter (Signed)
She could try OTC Hydrocortisone if she would like. I would not recommend a stronger steroid. (She was here today in a visit and did not mention this)

## 2015-04-06 NOTE — Progress Notes (Signed)
Subjective:    Patient ID: Nicole Garcia, female    DOB: [birth year], 69 y.o.   MRN: [id number]  HPI  69YO female presents for follow up.  Added Wellbutrin at last visit to help with anxiety/depression. Plans to try at later date. Taking Alprazolam bid.  Recently started on antibiotic by podiatry for toe infection. Ongoing for 3 weeks. Taking Keflex bid to tid. Has follow up next week.  Decided to stop the Wellbutrin because of worry about drug interaction.  Trying to follow a healthy diet to help with weight loss.    Wt Readings from Last 3 Encounters:  04/06/15 196 lb 2 oz (88.962 kg)  03/22/15 195 lb 2 oz (88.508 kg)  01/24/15 193 lb 4 oz (87.658 kg)     Past Medical History  Diagnosis Date  . Hypertension   . Hyperglycemia   . Diverticulitis   . History of chicken pox   . Depression   . GERD (gastroesophageal reflux disease)   . Diabetes mellitus without complication     diet controlled   Family History  Problem Relation Age of Onset  . Diabetes Father   . Cancer Sister 88    pancreatic  . Cancer Brother 70    brain  . Cancer Sister     lung   Past Surgical History  Procedure Laterality Date  . Bladder suspension  1997    microinvasive Protogen  . Cholecystectomy    . Abdominal hysterectomy    . Vaginal delivery      x2  . Breast surgery      biopsy 1966 and 2000   Social History   Social History  . Marital Status: Married    Spouse Name: N/A  . Number of Children: N/A  . Years of Education: N/A   Social History Main Topics  . Smoking status: Never Smoker   . Smokeless tobacco: None  . Alcohol Use: No  . Drug Use: No  . Sexual Activity: Not Asked   Other Topics Concern  . None   Social History Narrative   Lives in [place] with husband. Has dog in home. Son lives in [place] and [place].      Work - [employer], owned 25years.          Review of Systems  Constitutional: Negative for fever, chills, appetite change, fatigue and  unexpected weight change.  Eyes: Negative for visual disturbance.  Respiratory: Negative for shortness of breath.   Cardiovascular: Negative for chest pain and leg swelling.  Gastrointestinal: Negative for nausea, vomiting, abdominal pain, diarrhea and constipation.  Musculoskeletal: Positive for arthralgias.  Skin: Positive for color change and wound. Negative for rash.  Hematological: Negative for adenopathy. Does not bruise/bleed easily.  Psychiatric/Behavioral: Negative for sleep disturbance and dysphoric mood. The patient is nervous/anxious.        Objective:    BP 118/75 mmHg  Pulse 71  Temp(Src) 98 F (36.7 C) (Oral)  Ht 4' 11.25" (1.505 m)  Wt 196 lb 2 oz (88.962 kg)  BMI 39.28 kg/m2  SpO2 96% Physical Exam  Constitutional: She is oriented to person, place, and time. She appears well-developed and well-nourished. No distress.  HENT:  Head: Normocephalic and atraumatic.  Right Ear: External ear normal.  Left Ear: External ear normal.  Nose: Nose normal.  Mouth/Throat: Oropharynx is clear and moist. No oropharyngeal exudate.  Eyes: Conjunctivae are normal. Pupils are equal, round, and reactive to light. Right eye exhibits no discharge. Left  eye exhibits no discharge. No scleral icterus.  Neck: Normal range of motion. Neck supple. No tracheal deviation present. No thyromegaly present.  Cardiovascular: Normal rate, regular rhythm, normal heart sounds and intact distal pulses.  Exam reveals no gallop and no friction rub.   No murmur heard. Pulmonary/Chest: Effort normal and breath sounds normal. No respiratory distress. She has no wheezes. She has no rales. She exhibits no tenderness.  Musculoskeletal: Normal range of motion. She exhibits no edema or tenderness.  Lymphadenopathy:    She has no cervical adenopathy.  Neurological: She is alert and oriented to person, place, and time. No cranial nerve deficit. She exhibits normal muscle tone. Coordination normal.  Skin: Skin is  warm and dry. No rash noted. She is not diaphoretic. There is erythema. No pallor.     Psychiatric: She has a normal mood and affect. Her behavior is normal. Judgment and thought content normal.          Assessment & Plan:   Problem List Items Addressed This Visit      Unprioritized   Adjustment disorder with mixed anxiety and depressed mood    Symptoms well controlled with prn alprazolam. She will resume Wellbutrin after she stops Kelfex (her preference).      Diabetes mellitus, type 2    Recent BG control excellent. Will plan to repeat A1c in 3 months.       Essential hypertension    BP Readings from Last 3 Encounters:  04/06/15 118/75  03/22/15 122/74  01/24/15 138/80   BP well controlled. Reviewed labs which showed mild renal insufficiency. Will repeat labs in 3 months.      Paronychia, toe - Primary    Encouraged her to continue Keflex. Follow up with podiatry as scheduled or sooner if pain or redness is worsening.      Relevant Medications   cephALEXin (KEFLEX) 500 MG capsule   fluconazole (DIFLUCAN) 150 MG tablet   Renal insufficiency    Reviewed labs showing mild renal insufficiency. Will plan to repeat labs in 3 months.          Return in about 3 months (around 07/07/2015) for Recheck of Diabetes.

## 2015-04-06 NOTE — Assessment & Plan Note (Signed)
Reviewed labs showing mild renal insufficiency. Will plan to repeat labs in 3 months.

## 2015-04-06 NOTE — Patient Instructions (Signed)
Follow up 3 months

## 2015-04-06 NOTE — Progress Notes (Signed)
Pre visit review using our clinic review tool, if applicable. No additional management support is needed unless otherwise documented below in the visit note. 

## 2015-04-06 NOTE — Assessment & Plan Note (Signed)
BP Readings from Last 3 Encounters:  04/06/15 118/75  03/22/15 122/74  01/24/15 138/80   BP well controlled. Reviewed labs which showed mild renal insufficiency. Will repeat labs in 3 months.

## 2015-04-06 NOTE — Assessment & Plan Note (Signed)
Symptoms well controlled with prn alprazolam. She will resume Wellbutrin after she stops Kelfex (her preference).

## 2015-04-07 NOTE — Telephone Encounter (Signed)
Spoke with her husband.  He will relay the message to the patent to get OTC the hydrocortisone first and then let us know if she has continued problems.

## 2015-05-02 ENCOUNTER — Telehealth: Payer: Self-pay | Admitting: Internal Medicine

## 2015-05-02 NOTE — Telephone Encounter (Signed)
Pt called about some kind of bump on her bottom area. Pt is concerned about it being there. No appt avail. Let me know. Thank You!

## 2015-05-03 NOTE — Telephone Encounter (Signed)
Pt scheduled  

## 2015-05-05 ENCOUNTER — Telehealth: Payer: Self-pay | Admitting: Internal Medicine

## 2015-05-05 NOTE — Telephone Encounter (Signed)
We have blocked time to make this appointment work.  Thanks

## 2015-05-05 NOTE — Telephone Encounter (Signed)
Pt was scheduled for a 15 min length. Is there any where on Monday where we can sch pt for a 30 min slot? Let me know. Thank You!

## 2015-05-08 ENCOUNTER — Encounter: Payer: Self-pay | Admitting: Internal Medicine

## 2015-05-08 ENCOUNTER — Ambulatory Visit (INDEPENDENT_AMBULATORY_CARE_PROVIDER_SITE_OTHER): Payer: PPO | Admitting: Internal Medicine

## 2015-05-08 VITALS — BP 122/84 | HR 85 | Temp 98.1°F | Ht 59.25 in | Wt 196.5 lb

## 2015-05-08 DIAGNOSIS — N898 Other specified noninflammatory disorders of vagina: Secondary | ICD-10-CM | POA: Diagnosis not present

## 2015-05-08 NOTE — Progress Notes (Signed)
Pre visit review using our clinic review tool, if applicable. No additional management support is needed unless otherwise documented below in the visit note. 

## 2015-05-08 NOTE — Patient Instructions (Signed)
We will set up an evaluation with Dr. Garwin Brothers in gynecology.  Follow up here as needed.

## 2015-05-08 NOTE — Assessment & Plan Note (Signed)
Labial lesion most c/w verrucae. Will set up GYN evaluation for possible removal. Follow up prn.

## 2015-05-08 NOTE — Progress Notes (Signed)
Subjective:    Patient ID: Nicole Garcia, female    DOB: 1946-05-17, 69 y.o.   MRN: [id number]  HPI  69YO female presents for acute visit.  Lesion outside of vagina - Shown to dermatologist years ago, but no intervention completed. Told it was a scar. Feels that area is getting larger in size. Non-tender. Tried to squeeze but no fluid came out. No fever, chills.   Past Medical History  Diagnosis Date  . Hypertension   . Hyperglycemia   . Diverticulitis   . History of chicken pox   . Depression   . GERD (gastroesophageal reflux disease)   . Diabetes mellitus without complication     diet controlled   Family History  Problem Relation Age of Onset  . Diabetes Father   . Cancer Sister 60    pancreatic  . Cancer Brother 71    brain  . Cancer Sister     lung   Past Surgical History  Procedure Laterality Date  . Bladder suspension  1997    microinvasive Protogen  . Cholecystectomy    . Abdominal hysterectomy    . Vaginal delivery      x2  . Breast surgery      biopsy 1966 and 2000   Social History   Social History  . Marital Status: Married    Spouse Name: N/A  . Number of Children: N/A  . Years of Education: N/A   Social History Main Topics  . Smoking status: Never Smoker   . Smokeless tobacco: None  . Alcohol Use: No  . Drug Use: No  . Sexual Activity: Not Asked   Other Topics Concern  . None   Social History Narrative   Lives in [place] with husband. Has dog in home. Son lives in [place] and [place].      Work - [employer], owned 25years.          Review of Systems  Constitutional: Negative for fever, chills, appetite change, fatigue and unexpected weight change.  Eyes: Negative for visual disturbance.  Respiratory: Negative for shortness of breath.   Cardiovascular: Negative for chest pain and leg swelling.  Gastrointestinal: Negative for abdominal pain.  Genitourinary: Positive for genital sores and vaginal pain. Negative for vaginal  bleeding, vaginal discharge and pelvic pain.  Skin: Negative for color change and rash.  Hematological: Negative for adenopathy. Does not bruise/bleed easily.  Psychiatric/Behavioral: Negative for dysphoric mood. The patient is not nervous/anxious.        Objective:    BP 122/84 mmHg  Pulse 85  Temp(Src) 98.1 F (36.7 C) (Oral)  Ht 4' 11.25" (1.505 m)  Wt 196 lb 8 oz (89.132 kg)  BMI 39.35 kg/m2  SpO2 97% Physical Exam  Constitutional: She is oriented to person, place, and time. She appears well-developed and well-nourished. No distress.  HENT:  Head: Normocephalic and atraumatic.  Right Ear: External ear normal.  Left Ear: External ear normal.  Nose: Nose normal.  Mouth/Throat: Oropharynx is clear and moist.  Eyes: Conjunctivae are normal. Pupils are equal, round, and reactive to light. Right eye exhibits no discharge. Left eye exhibits no discharge. No scleral icterus.  Neck: Normal range of motion. Neck supple. No tracheal deviation present. No thyromegaly present.  Pulmonary/Chest: Effort normal. No accessory muscle usage. No tachypnea. She has no decreased breath sounds. She has no rhonchi.  Genitourinary:    There is lesion on the right labia. There is no tenderness on the right labia.  Musculoskeletal: Normal range of motion. She exhibits no edema or tenderness.  Lymphadenopathy:    She has no cervical adenopathy.  Neurological: She is alert and oriented to person, place, and time. No cranial nerve deficit. She exhibits normal muscle tone. Coordination normal.  Skin: Skin is warm and dry. No rash noted. She is not diaphoretic. No erythema. No pallor.  Psychiatric: She has a normal mood and affect. Her behavior is normal. Judgment and thought content normal.          Assessment & Plan:   Problem List Items Addressed This Visit      Unprioritized   Vaginal lesion - Primary    Labial lesion most c/w verrucae. Will set up GYN evaluation for possible removal.  Follow up prn.      Relevant Orders   Ambulatory referral to Gynecology       No Follow-up on file.

## 2015-05-10 ENCOUNTER — Ambulatory Visit (HOSPITAL_COMMUNITY)
Admission: RE | Admit: 2015-05-10 | Discharge: 2015-05-10 | Disposition: A | Discharge status: Home / Self Care | Payer: PPO | Source: Ambulatory Visit | Attending: Internal Medicine | Admitting: Internal Medicine

## 2015-05-10 ENCOUNTER — Other Ambulatory Visit: Payer: Self-pay | Admitting: Internal Medicine

## 2015-05-10 DIAGNOSIS — Z1231 Encounter for screening mammogram for malignant neoplasm of breast: Secondary | ICD-10-CM

## 2015-05-10 DIAGNOSIS — Z1239 Encounter for other screening for malignant neoplasm of breast: Secondary | ICD-10-CM

## 2015-06-19 ENCOUNTER — Emergency Department: Payer: PPO

## 2015-06-19 ENCOUNTER — Encounter: Payer: Self-pay | Admitting: Emergency Medicine

## 2015-06-19 ENCOUNTER — Emergency Department
Admission: EM | Admit: 2015-06-19 | Discharge: 2015-06-19 | Disposition: A | Discharge status: Home / Self Care | Payer: PPO | Attending: Emergency Medicine | Admitting: Emergency Medicine

## 2015-06-19 DIAGNOSIS — S0003XA Contusion of scalp, initial encounter: Secondary | ICD-10-CM | POA: Insufficient documentation

## 2015-06-19 DIAGNOSIS — I10 Essential (primary) hypertension: Secondary | ICD-10-CM | POA: Insufficient documentation

## 2015-06-19 DIAGNOSIS — S301XXA Contusion of abdominal wall, initial encounter: Secondary | ICD-10-CM | POA: Diagnosis not present

## 2015-06-19 DIAGNOSIS — Y9389 Activity, other specified: Secondary | ICD-10-CM | POA: Diagnosis not present

## 2015-06-19 DIAGNOSIS — S0990XA Unspecified injury of head, initial encounter: Secondary | ICD-10-CM | POA: Diagnosis present

## 2015-06-19 DIAGNOSIS — S40012A Contusion of left shoulder, initial encounter: Secondary | ICD-10-CM | POA: Insufficient documentation

## 2015-06-19 DIAGNOSIS — S7002XA Contusion of left hip, initial encounter: Secondary | ICD-10-CM | POA: Insufficient documentation

## 2015-06-19 DIAGNOSIS — E119 Type 2 diabetes mellitus without complications: Secondary | ICD-10-CM | POA: Insufficient documentation

## 2015-06-19 DIAGNOSIS — Y998 Other external cause status: Secondary | ICD-10-CM | POA: Diagnosis not present

## 2015-06-19 DIAGNOSIS — W01198A Fall on same level from slipping, tripping and stumbling with subsequent striking against other object, initial encounter: Secondary | ICD-10-CM | POA: Diagnosis not present

## 2015-06-19 DIAGNOSIS — Y92009 Unspecified place in unspecified non-institutional (private) residence as the place of occurrence of the external cause: Secondary | ICD-10-CM | POA: Insufficient documentation

## 2015-06-19 DIAGNOSIS — R55 Syncope and collapse: Secondary | ICD-10-CM | POA: Insufficient documentation

## 2015-06-19 DIAGNOSIS — W19XXXA Unspecified fall, initial encounter: Secondary | ICD-10-CM

## 2015-06-19 DIAGNOSIS — T148XXA Other injury of unspecified body region, initial encounter: Secondary | ICD-10-CM

## 2015-06-19 LAB — COMPREHENSIVE METABOLIC PANEL
ALT: 19 U/L (ref 14–54)
ANION GAP: 9 (ref 5–15)
AST: 20 U/L (ref 15–41)
Albumin: 4.4 g/dL (ref 3.5–5.0)
Alkaline Phosphatase: 79 U/L (ref 38–126)
BUN: 9 mg/dL (ref 6–20)
CALCIUM: 9.4 mg/dL (ref 8.9–10.3)
CHLORIDE: 106 mmol/L (ref 101–111)
CO2: 26 mmol/L (ref 22–32)
CREATININE: 0.83 mg/dL (ref 0.44–1.00)
Glucose, Bld: 102 mg/dL — ABNORMAL HIGH (ref 65–99)
Potassium: 4.2 mmol/L (ref 3.5–5.1)
SODIUM: 141 mmol/L (ref 135–145)
Total Bilirubin: 0.4 mg/dL (ref 0.3–1.2)
Total Protein: 8.1 g/dL (ref 6.5–8.1)

## 2015-06-19 LAB — CBC
HCT: 41.9 % (ref 35.0–47.0)
HEMOGLOBIN: 14.1 g/dL (ref 12.0–16.0)
MCH: 29.4 pg (ref 26.0–34.0)
MCHC: 33.5 g/dL (ref 32.0–36.0)
MCV: 87.6 fL (ref 80.0–100.0)
Platelets: 321 10*3/uL (ref 150–440)
RBC: 4.79 MIL/uL (ref 3.80–5.20)
RDW: 14.8 % — AB (ref 11.5–14.5)
WBC: 8.2 10*3/uL (ref 3.6–11.0)

## 2015-06-19 LAB — TROPONIN I

## 2015-06-19 MED ORDER — OXYCODONE-ACETAMINOPHEN 5-325 MG PO TABS: 2.0000 | ORAL_TABLET | Freq: Four times a day (QID) | ORAL | Status: DC | PRN

## 2015-06-19 MED ORDER — OXYCODONE-ACETAMINOPHEN 5-325 MG PO TABS
2.0000 | ORAL_TABLET | Freq: Once | ORAL | Status: AC
  Administered 2015-06-19: 2 via ORAL

## 2015-06-19 MED ORDER — OXYCODONE-ACETAMINOPHEN 5-325 MG PO TABS
ORAL_TABLET | ORAL | Status: AC
  Administered 2015-06-19: 2 via ORAL
  Filled 2015-06-19: qty 2

## 2015-06-19 NOTE — ED Notes (Addendum)
Pt to ed with c/o head injury.  Pt states she fell yesterday, had loss of consciousness now with c/o left hip pain, headache, left shoulder pain and neck pain post fall.  Pt alert and oriented at this time.

## 2015-06-19 NOTE — ED Provider Notes (Signed)
[facility] Emergency Department Provider Note     Time seen: ----------------------------------------- 2:48 PM on 06/19/2015 -----------------------------------------    I have reviewed the triage vital signs and the nursing notes.   HISTORY  Chief Complaint Head Injury    HPI Nicole Garcia is a 69 y.o. female who presents to ER after head injury. Patient states she fell yesterday, had loss of consciousness of uncertain etiology. Patient feels like she passed out rather than tripping and falling. Patient states she was doing some work at her home and woke up on the ground after falling. She is complaining of left hip pain, headache and left shoulder pain after the fall. Bruise was noted on the right flank, she does not have an expiration for the fall nor has she had a history of syncope.   Past Medical History  Diagnosis Date  . Hypertension   . Hyperglycemia   . Diverticulitis   . History of chicken pox   . Depression   . GERD (gastroesophageal reflux disease)   . Diabetes mellitus without complication ([place])     diet controlled    Patient Active Problem List   Diagnosis Date Noted  . Vaginal lesion 05/08/2015  . Paronychia, toe 04/06/2015  . Renal insufficiency 04/06/2015  . Diverticulitis 03/22/2015  . Screening for breast cancer 03/22/2015  . Obesity (BMI 30-39.9) 03/22/2015  . Muscle spasm 01/24/2015  . Diabetes mellitus, type 2 ([place]) 01/11/2015  . Adjustment disorder with mixed anxiety and depressed mood 12/29/2007  . Essential hypertension 12/29/2007  . REFLUX ESOPHAGITIS 12/15/2007  . IRRITABLE BOWEL SYNDROME 12/15/2007    Past Surgical History  Procedure Laterality Date  . Bladder suspension  1997    microinvasive Protogen  . Cholecystectomy    . Abdominal hysterectomy    . Vaginal delivery      x2  . Breast surgery      biopsy 1966 and 2000    Allergies Iohexol; Bactroban; Ciprofloxacin; Codeine; Levofloxacin;  Metformin and related; Nitrofurantoin; and Sulfonamide derivatives  Social History Social History  Substance Use Topics  . Smoking status: Never Smoker   . Smokeless tobacco: None  . Alcohol Use: No    Review of Systems Constitutional: Negative for fever. Positive for head injury Eyes: Negative for visual changes. ENT: Negative for sore throat. Cardiovascular: Negative for chest pain. Respiratory: Negative for shortness of breath. Gastrointestinal: Negative for abdominal pain, vomiting and diarrhea. Genitourinary: Negative for dysuria. Musculoskeletal: Positive for left hip and left shoulder pain. Skin: Negative for rash. Neurological: Negative for headaches, focal weakness or numbness.  10-point ROS otherwise negative.  ____________________________________________   PHYSICAL EXAM:  VITAL SIGNS: ED Triage Vitals  Enc Vitals Group     BP 06/19/15 1205 190/78 mmHg     Pulse Rate 06/19/15 1205 84     Resp 06/19/15 1205 20     Temp 06/19/15 1205 98.2 F (36.8 C)     Temp Source 06/19/15 1205 Oral     SpO2 06/19/15 1205 99 %     Weight 06/19/15 1205 186 lb (84.369 kg)     Height 06/19/15 1205 4\' 11"  (1.499 m)     Head Cir --      Peak Flow --      Pain Score 06/19/15 1206 3     Pain Loc --      Pain Edu? --      Excl. in [place]? --     Constitutional: Alert and oriented. Well appearing and  in no distress. Eyes: Conjunctivae are normal. PERRL. Normal extraocular movements. ENT   Head: Left-sided occipital scalp hematoma   Nose: No congestion/rhinnorhea.   Mouth/Throat: Mucous membranes are moist.   Neck: No stridor. Cardiovascular: Normal rate, regular rhythm. Normal and symmetric distal pulses are present in all extremities. No murmurs, rubs, or gallops. Respiratory: Normal respiratory effort without tachypnea nor retractions. Breath sounds are clear and equal bilaterally. No wheezes/rales/rhonchi. Gastrointestinal: Soft and nontender. No distention. No  abdominal bruits.  Musculoskeletal: Left hip and left shoulder tenderness, normal range of motion of these extremities. Neurologic:  Normal speech and language. No gross focal neurologic deficits are appreciated. Speech is normal. No gait instability. Skin:  Small bruises noted on the right flank. These are mildly tender to touch. Psychiatric: Mood and affect are normal. Speech and behavior are normal. Patient exhibits appropriate insight and judgment. ____________________________________________  EKG: Interpreted by me. Normal sinus rhythm with normal axis normal intervals. No evidence of hypertrophy or acute infarction. Rate is 68 bpm  ____________________________________________  ED COURSE:  Pertinent labs & imaging results that were available during my care of the patient were reviewed by me and considered in my medical decision making (see chart for details). We'll check cardiac labs, obtain imaging and reevaluate. ____________________________________________    LABS (pertinent positives/negatives)  Labs Reviewed  CBC - Abnormal; Notable for the following:    RDW 14.8 (*)    All other components within normal limits  COMPREHENSIVE METABOLIC PANEL - Abnormal; Notable for the following:    Glucose, Bld 102 (*)    All other components within normal limits  TROPONIN I    RADIOLOGY Images were viewed by me  CT head, left shoulder, left hip x-rays, chest x-ray IMPRESSION: No active disease of chest IMPRESSION: No acute intracranial pathology.  IMPRESSION: No acute findings. Degenerative changes are seen involving the [facility] joint and humeral head. IMPRESSION: No acute fractures identified of hip ____________________________________________  FINAL ASSESSMENT AND PLAN  Syncope, head injury, left shoulder and left hip contusions  Plan: Patient with labs and imaging as dictated above. It is unclear if she had syncope or if she just misstepped and fell. I tend to think she  did not pass out, more likely that she had a head injury from falling. Here her labs EKG and exam is normal. Advise follow-up with her doctor in 1-2 days for recheck.   Earleen Newport, MD   Earleen Newport, MD 06/19/15 [phone]

## 2015-06-19 NOTE — Discharge Instructions (Signed)
Head Injury, Adult You have a head injury. Headaches and throwing up (vomiting) are common after a head injury. It should be easy to wake up from sleeping. Sometimes you must stay in the hospital. Most problems happen within the first 24 hours. Side effects may occur up to 7-10 days after the injury.  WHAT ARE THE TYPES OF HEAD INJURIES? Head injuries can be as minor as a bump. Some head injuries can be more severe. More severe head injuries include:  A jarring injury to the brain (concussion).  A bruise of the brain (contusion). This mean there is bleeding in the brain that can cause swelling.  A cracked skull (skull fracture).  Bleeding in the brain that collects, clots, and forms a bump (hematoma). WHEN SHOULD I GET HELP RIGHT AWAY?   You are confused or sleepy.  You cannot be woken up.  You feel sick to your stomach (nauseous) or keep throwing up (vomiting).  Your dizziness or unsteadiness is getting worse.  You have very bad, lasting headaches that are not helped by medicine. Take medicines only as told by your doctor.  You cannot use your arms or legs like normal.  You cannot walk.  You notice changes in the black spots in the center of the colored part of your eye (pupil).  You have clear or bloody fluid coming from your nose or ears.  You have trouble seeing. During the next 24 hours after the injury, you must stay with someone who can watch you. This person should get help right away (call 911 in the [place] if you start to shake and are not able to control it (have seizures), you pass out, or you are unable to wake up. HOW CAN I PREVENT A HEAD INJURY IN THE FUTURE?  Wear seat belts.  Wear a helmet while bike riding and playing sports like football.  Stay away from dangerous activities around the house. WHEN CAN I RETURN TO NORMAL ACTIVITIES AND ATHLETICS? See your doctor before doing these activities. You should not do normal activities or play contact sports until 1  week after the following symptoms have stopped:  Headache that does not go away.  Dizziness.  Poor attention.  Confusion.  Memory problems.  Sickness to your stomach or throwing up.  Tiredness.  Fussiness.  Bothered by bright lights or loud noises.  Anxiousness or depression.  Restless sleep. MAKE SURE YOU:   Understand these instructions.  Will watch your condition.  Will get help right away if you are not doing well or get worse.   This information is not intended to replace advice given to you by your health care provider. Make sure you discuss any questions you have with your health care provider.   Document Released: 07/18/2008 Document Revised: 08/26/2014 Document Reviewed: 04/12/2013 Elsevier Interactive Patient Education 2016 [place], Adult A concussion, or closed-head injury, is a brain injury caused by a direct blow to the head or by a quick and sudden movement (jolt) of the head or neck. Concussions are usually not life-threatening. Even so, the effects of a concussion can be serious. If you have had a concussion before, you are more likely to experience concussion-like symptoms after a direct blow to the head.  CAUSES  Direct blow to the head, such as from running into another player during a soccer game, being hit in a fight, or hitting your head on a hard surface.  A jolt of the head or neck that causes the  brain to move back and forth inside the skull, such as in a car crash. SIGNS AND SYMPTOMS The signs of a concussion can be hard to notice. Early on, they may be missed by you, family members, and health care providers. You may look fine but act or feel differently. Symptoms are usually temporary, but they may last for days, weeks, or even longer. Some symptoms may appear right away while others may not show up for hours or days. Every head injury is different. Symptoms include:  Mild to moderate headaches that will not go away.  A  feeling of pressure inside your head.  Having more trouble than usual:  Learning or remembering things you have heard.  Answering questions.  Paying attention or concentrating.  Organizing daily tasks.  Making decisions and solving problems.  Slowness in thinking, acting or reacting, speaking, or reading.  Getting lost or being easily confused.  Feeling tired all the time or lacking energy (fatigued).  Feeling drowsy.  Sleep disturbances.  Sleeping more than usual.  Sleeping less than usual.  Trouble falling asleep.  Trouble sleeping (insomnia).  Loss of balance or feeling lightheaded or dizzy.  Nausea or vomiting.  Numbness or tingling.  Increased sensitivity to:  Sounds.  Lights.  Distractions.  Vision problems or eyes that tire easily.  Diminished sense of taste or smell.  Ringing in the ears.  Mood changes such as feeling sad or anxious.  Becoming easily irritated or angry for little or no reason.  Lack of motivation.  Seeing or hearing things other people do not see or hear (hallucinations). DIAGNOSIS Your health care provider can usually diagnose a concussion based on a description of your injury and symptoms. He or she will ask whether you passed out (lost consciousness) and whether you are having trouble remembering events that happened right before and during your injury. Your evaluation might include:  A brain scan to look for signs of injury to the brain. Even if the test shows no injury, you may still have a concussion.  Blood tests to be sure other problems are not present. TREATMENT  Concussions are usually treated in an emergency department, in urgent care, or at a clinic. You may need to stay in the hospital overnight for further treatment.  Tell your health care provider if you are taking any medicines, including prescription medicines, over-the-counter medicines, and natural remedies. Some medicines, such as blood thinners  (anticoagulants) and aspirin, may increase the chance of complications. Also tell your health care provider whether you have had alcohol or are taking illegal drugs. This information may affect treatment.  Your health care provider will send you home with important instructions to follow.  How fast you will recover from a concussion depends on many factors. These factors include how severe your concussion is, what part of your brain was injured, your age, and how healthy you were before the concussion.  Most people with mild injuries recover fully. Recovery can take time. In general, recovery is slower in older persons. Also, persons who have had a concussion in the past or have other medical problems may find that it takes longer to recover from their current injury. HOME CARE INSTRUCTIONS General Instructions  Carefully follow the directions your health care provider gave you.  Only take over-the-counter or prescription medicines for pain, discomfort, or fever as directed by your health care provider.  Take only those medicines that your health care provider has approved.  Do not drink alcohol until your  health care provider says you are well enough to do so. Alcohol and certain other drugs may slow your recovery and can put you at risk of further injury.  If it is harder than usual to remember things, write them down.  If you are easily distracted, try to do one thing at a time. For example, do not try to watch TV while fixing dinner.  Talk with family members or close friends when making important decisions.  Keep all follow-up appointments. Repeated evaluation of your symptoms is recommended for your recovery.  Watch your symptoms and tell others to do the same. Complications sometimes occur after a concussion. Older adults with a brain injury may have a higher risk of serious complications, such as a blood clot on the brain.  Tell your teachers, school nurse, school counselor,  coach, athletic trainer, or work Freight forwarder about your injury, symptoms, and restrictions. Tell them about what you can or cannot do. They should watch for:  Increased problems with attention or concentration.  Increased difficulty remembering or learning new information.  Increased time needed to complete tasks or assignments.  Increased irritability or decreased ability to cope with stress.  Increased symptoms.  Rest. Rest helps the brain to heal. Make sure you:  Get plenty of sleep at night. Avoid staying up late at night.  Keep the same bedtime hours on weekends and weekdays.  Rest during the day. Take daytime naps or rest breaks when you feel tired.  Limit activities that require a lot of thought or concentration. These include:  Doing homework or job-related work.  Watching TV.  Working on the computer.  Avoid any situation where there is potential for another head injury (football, hockey, soccer, basketball, martial arts, downhill snow sports and horseback riding). Your condition will get worse every time you experience a concussion. You should avoid these activities until you are evaluated by the appropriate follow-up health care providers. Returning To Your Regular Activities You will need to return to your normal activities slowly, not all at once. You must give your body and brain enough time for recovery.  Do not return to sports or other athletic activities until your health care provider tells you it is safe to do so.  Ask your health care provider when you can drive, ride a bicycle, or operate heavy machinery. Your ability to react may be slower after a brain injury. Never do these activities if you are dizzy.  Ask your health care provider about when you can return to work or school. Preventing Another Concussion It is very important to avoid another brain injury, especially before you have recovered. In rare cases, another injury can lead to permanent brain  damage, brain swelling, or death. The risk of this is greatest during the first 7-10 days after a head injury. Avoid injuries by:  Wearing a seat belt when riding in a car.  Drinking alcohol only in moderation.  Wearing a helmet when biking, skiing, skateboarding, skating, or doing similar activities.  Avoiding activities that could lead to a second concussion, such as contact or recreational sports, until your health care provider says it is okay.  Taking safety measures in your home.  Remove clutter and tripping hazards from floors and stairways.  Use grab bars in bathrooms and handrails by stairs.  Place non-slip mats on floors and in bathtubs.  Improve lighting in dim areas. SEEK MEDICAL CARE IF:  You have increased problems paying attention or concentrating.  You have increased difficulty  remembering or learning new information.  You need more time to complete tasks or assignments than before.  You have increased irritability or decreased ability to cope with stress.  You have more symptoms than before. Seek medical care if you have any of the following symptoms for more than 2 weeks after your injury:  Lasting (chronic) headaches.  Dizziness or balance problems.  Nausea.  Vision problems.  Increased sensitivity to noise or light.  Depression or mood swings.  Anxiety or irritability.  Memory problems.  Difficulty concentrating or paying attention.  Sleep problems.  Feeling tired all the time. SEEK IMMEDIATE MEDICAL CARE IF:  You have severe or worsening headaches. These may be a sign of a blood clot in the brain.  You have weakness (even if only in one hand, leg, or part of the face).  You have numbness.  You have decreased coordination.  You vomit repeatedly.  You have increased sleepiness.  One pupil is larger than the other.  You have convulsions.  You have slurred speech.  You have increased confusion. This may be a sign of a blood  clot in the brain.  You have increased restlessness, agitation, or irritability.  You are unable to recognize people or places.  You have neck pain.  It is difficult to wake you up.  You have unusual behavior changes.  You lose consciousness. MAKE SURE YOU:  Understand these instructions.  Will watch your condition.  Will get help right away if you are not doing well or get worse.   This information is not intended to replace advice given to you by your health care provider. Make sure you discuss any questions you have with your health care provider.   Document Released: 10/26/2003 Document Revised: 08/26/2014 Document Reviewed: 02/25/2013 Elsevier Interactive Patient Education 2016 [place] A contusion is a deep bruise. Contusions are the result of a blunt injury to tissues and muscle fibers under the skin. The injury causes bleeding under the skin. The skin overlying the contusion may turn blue, purple, or yellow. Minor injuries will give you a painless contusion, but more severe contusions may stay painful and swollen for a few weeks.  CAUSES  This condition is usually caused by a blow, trauma, or direct force to an area of the body. SYMPTOMS  Symptoms of this condition include:  Swelling of the injured area.  Pain and tenderness in the injured area.  Discoloration. The area may have redness and then turn blue, purple, or yellow. DIAGNOSIS  This condition is diagnosed based on a physical exam and medical history. An X-ray, CT scan, or MRI may be needed to determine if there are any associated injuries, such as broken bones (fractures). TREATMENT  Specific treatment for this condition depends on what area of the body was injured. In general, the best treatment for a contusion is resting, icing, applying pressure to (compression), and elevating the injured area. This is often called the RICE strategy. Over-the-counter anti-inflammatory medicines may also be  recommended for pain control.  HOME CARE INSTRUCTIONS   Rest the injured area.  If directed, apply ice to the injured area:  Put ice in a plastic bag.  Place a towel between your skin and the bag.  Leave the ice on for 20 minutes, 2-3 times per day.  If directed, apply light compression to the injured area using an elastic bandage. Make sure the bandage is not wrapped too tightly. Remove and reapply the bandage as directed by your  health care provider.  If possible, raise (elevate) the injured area above the level of your heart while you are sitting or lying down.  Take over-the-counter and prescription medicines only as told by your health care provider. SEEK MEDICAL CARE IF:  Your symptoms do not improve after several days of treatment.  Your symptoms get worse.  You have difficulty moving the injured area. SEEK IMMEDIATE MEDICAL CARE IF:   You have severe pain.  You have numbness in a hand or foot.  Your hand or foot turns pale or cold.   This information is not intended to replace advice given to you by your health care provider. Make sure you discuss any questions you have with your health care provider.   Document Released: 05/15/2005 Document Revised: 04/26/2015 Document Reviewed: 12/21/2014 Elsevier Interactive Patient Education [employer].

## 2015-06-21 ENCOUNTER — Other Ambulatory Visit: Payer: Self-pay | Admitting: Internal Medicine

## 2015-06-21 DIAGNOSIS — R55 Syncope and collapse: Secondary | ICD-10-CM

## 2015-06-22 ENCOUNTER — Ambulatory Visit
Admission: RE | Admit: 2015-06-22 | Discharge: 2015-06-22 | Disposition: A | Discharge status: Home / Self Care | Payer: PPO | Source: Ambulatory Visit | Attending: Internal Medicine | Admitting: Internal Medicine

## 2015-06-22 ENCOUNTER — Encounter: Payer: Self-pay | Admitting: *Deleted

## 2015-06-22 ENCOUNTER — Ambulatory Visit: Payer: PPO | Admitting: Internal Medicine

## 2015-06-22 DIAGNOSIS — R55 Syncope and collapse: Secondary | ICD-10-CM | POA: Diagnosis not present

## 2015-06-26 ENCOUNTER — Encounter: Payer: Self-pay | Admitting: Internal Medicine

## 2015-06-27 DIAGNOSIS — Z0289 Encounter for other administrative examinations: Secondary | ICD-10-CM

## 2015-06-30 ENCOUNTER — Telehealth: Payer: Self-pay | Admitting: Internal Medicine

## 2015-06-30 NOTE — Telephone Encounter (Signed)
Spoke with pt about scheduling AWV. Pt stated that she was advised by Almyra Free that she will schedule a lab appt on 07/17/15 at 0900. No appt was made and no labs ordered. Pt is scheduled to have a AWV on 07/11/15 at 0930. Please order any labs that are due. Pt will come in fasting for appt.msn

## 2015-07-03 NOTE — Telephone Encounter (Signed)
NO. This patient has chosen to stay under the care of Dr. Emily Filbert. I have have sent her a letter in this regard. She should continue to follow with Dr. Sabra Heck. Cancel all future visits with me. Remove me as PCP

## 2015-07-10 ENCOUNTER — Other Ambulatory Visit: Payer: Self-pay | Admitting: Internal Medicine

## 2015-07-10 ENCOUNTER — Ambulatory Visit: Payer: PPO | Admitting: Internal Medicine

## 2015-07-10 DIAGNOSIS — R519 Headache, unspecified: Secondary | ICD-10-CM

## 2015-07-10 DIAGNOSIS — R51 Headache: Principal | ICD-10-CM

## 2015-07-11 ENCOUNTER — Ambulatory Visit: Payer: PPO

## 2015-07-31 ENCOUNTER — Other Ambulatory Visit: Payer: Self-pay | Admitting: Internal Medicine

## 2015-07-31 ENCOUNTER — Ambulatory Visit
Admission: RE | Admit: 2015-07-31 | Discharge: 2015-07-31 | Disposition: A | Discharge status: Home / Self Care | Payer: PPO | Source: Ambulatory Visit | Attending: Internal Medicine | Admitting: Internal Medicine

## 2015-07-31 DIAGNOSIS — R519 Headache, unspecified: Secondary | ICD-10-CM

## 2015-07-31 DIAGNOSIS — R51 Headache: Secondary | ICD-10-CM | POA: Insufficient documentation

## 2015-08-22 DIAGNOSIS — R102 Pelvic and perineal pain: Secondary | ICD-10-CM | POA: Diagnosis not present

## 2015-09-08 ENCOUNTER — Telehealth: Payer: Self-pay | Admitting: Cardiovascular Disease

## 2015-09-08 NOTE — Telephone Encounter (Signed)
3 attempts to schedule fu from recall list.  Unable to schedule.  Deleting recall.

## 2015-09-14 DIAGNOSIS — E119 Type 2 diabetes mellitus without complications: Secondary | ICD-10-CM | POA: Diagnosis not present

## 2015-09-14 DIAGNOSIS — I10 Essential (primary) hypertension: Secondary | ICD-10-CM | POA: Diagnosis not present

## 2015-09-20 DIAGNOSIS — L600 Ingrowing nail: Secondary | ICD-10-CM | POA: Diagnosis not present

## 2015-10-02 ENCOUNTER — Ambulatory Visit (INDEPENDENT_AMBULATORY_CARE_PROVIDER_SITE_OTHER): Payer: PPO | Admitting: Cardiovascular Disease

## 2015-10-02 ENCOUNTER — Encounter: Payer: Self-pay | Admitting: Cardiovascular Disease

## 2015-10-02 VITALS — BP 142/86 | HR 82 | Ht 59.0 in | Wt 193.5 lb

## 2015-10-02 DIAGNOSIS — I10 Essential (primary) hypertension: Secondary | ICD-10-CM

## 2015-10-02 NOTE — Patient Instructions (Signed)
Medication Instructions: No changes.   Labwork: None.   Procedures/Testing: None.   Follow-Up: As needed with Dr. Arida.   Any Additional Special Instructions Will Be Listed Below (If Applicable).     If you need a refill on your cardiac medications before your next appointment, please call your pharmacy.   

## 2015-10-02 NOTE — Assessment & Plan Note (Signed)
This seems to be reasonably controlled. No further episodes of chest pain. She can follow-up with me as needed.  Biggest issue seems to be anxiety and possible depression. I asked her to discuss with Dr. Sabra Heck and maybe consider treatment with an SSRI.

## 2015-10-02 NOTE — Progress Notes (Signed)
Primary care physician: Dr. Emily Filbert  HPI  This is a pleasant 70 year old female who is here today for follow-up visit regarding  chest pain and shortness of breath. She has no previous cardiac history. She has no history of diabetes or thyroid disease. She does have known history of hypertension  A treadmill nuclear stress test in January 2016  showed no evidence of ischemia with normal ejection fraction. Echocardiogram was overall unremarkable except for mild diastolic dysfunction.   Given exaggerated heart rate response to exercise and elevated blood pressure, I started her on metoprolol 25 mg twice daily. She is currently taking this once daily.  She is doing reasonably well and denies any chest pain. Dyspnea is stable. No palpitations. She continues to be anxious and under stress. She is tearful today and does not know the reason.  Allergies  Allergen Reactions  . Iohexol Anaphylaxis    Pt blacked out after receiving iv contrast in the past  . Bactroban [Mupirocin Calcium]   . Ciprofloxacin   . Codeine   . Levofloxacin   . Metformin And Related     diarrhea  . Nitrofurantoin   . Sulfonamide Derivatives      Current Outpatient Prescriptions on File Prior to Visit  Medication Sig Dispense Refill  . ALPRAZolam (XANAX) 0.5 MG tablet Take 0.5 mg by mouth 2 (two) times daily as needed for anxiety.     Marland Kitchen aspirin 81 MG tablet Take 81 mg by mouth daily.    . cholecalciferol (VITAMIN D) 1000 UNITS tablet Take 1,000 Units by mouth daily.    Marland Kitchen docusate sodium (COLACE) 100 MG capsule Take 100 mg by mouth daily.    Marland Kitchen esomeprazole (NEXIUM) 20 MG capsule Take 20 mg by mouth daily at 12 noon.    . furosemide (LASIX) 20 MG tablet Take 20 mg by mouth as needed.     . magnesium oxide (MAG-OX) 400 MG tablet Take 400 mg by mouth daily.    . metoprolol tartrate (LOPRESSOR) 25 MG tablet Take 1 tablet (25 mg total) by mouth 2 (two) times daily. (Patient taking differently: Take by mouth daily. )  60 tablet 6  . omeprazole (PRILOSEC) 40 MG capsule Take 1 capsule (40 mg total) by mouth daily. 90 capsule 3   No current facility-administered medications on file prior to visit.     Past Medical History  Diagnosis Date  . Hypertension   . Hyperglycemia   . Diverticulitis   . History of chicken pox   . Depression   . GERD (gastroesophageal reflux disease)   . Diabetes mellitus without complication (HCC)     diet controlled  . Concussion      Past Surgical History  Procedure Laterality Date  . Bladder suspension  1997    microinvasive Protogen  . Cholecystectomy    . Abdominal hysterectomy    . Vaginal delivery      x2  . Breast surgery      biopsy 1966 and 2000  . Toe surgery    . Root canal       Family History  Problem Relation Age of Onset  . Diabetes Father   . Cancer Sister 77    pancreatic  . Cancer Brother 33    brain  . Cancer Sister     lung     Social History   Social History  . Marital Status: Married    Spouse Name: N/A  . Number of Children: N/A  . Years  of Education: N/A   Occupational History  . Not on file.   Social History Main Topics  . Smoking status: Never Smoker   . Smokeless tobacco: Not on file  . Alcohol Use: No  . Drug Use: No  . Sexual Activity: Not on file   Other Topics Concern  . Not on file   Social History Narrative   Lives in [place] with husband. Has dog in home. Son lives in [place] and [place].      Work - [employer], owned 25years.           ROS A 10 point review of system was performed. It is negative other than that mentioned in the history of present illness.   PHYSICAL EXAM   BP 142/86 mmHg  Pulse 82  Ht 4\' 11"  (1.499 m)  Wt 193 lb 8 oz (87.771 kg)  BMI 39.06 kg/m2 Constitutional: She is oriented to person, place, and time. She appears well-developed and well-nourished. No distress.  HENT: No nasal discharge.  Head: Normocephalic and atraumatic.  Eyes: Pupils are equal and round. No  discharge.  Neck: Normal range of motion. Neck supple. No JVD present. No thyromegaly present. No carotid bruits Cardiovascular: Normal rate, regular rhythm, normal heart sounds. Exam reveals no gallop and no friction rub. No murmur heard.  Pulmonary/Chest: Effort normal and breath sounds normal. No stridor. No respiratory distress. She has no wheezes. She has no rales. She exhibits no tenderness.  Abdominal: Soft. Bowel sounds are normal. She exhibits no distension. There is no tenderness. There is no rebound and no guarding.  Musculoskeletal: Normal range of motion. She exhibits no edema and no tenderness.  Neurological: She is alert and oriented to person, place, and time. Coordination normal.  Skin: Skin is warm and dry. No rash noted. She is not diaphoretic. No erythema. No pallor.  Psychiatric: She has a normal mood and affect. Her behavior is normal. Judgment and thought content normal.  Normal distal pulses.   EKG: Normal sinus rhythm with low voltage.  ASSESSMENT AND PLAN

## 2015-10-03 DIAGNOSIS — J039 Acute tonsillitis, unspecified: Secondary | ICD-10-CM | POA: Diagnosis not present

## 2015-10-03 DIAGNOSIS — I10 Essential (primary) hypertension: Secondary | ICD-10-CM | POA: Diagnosis not present

## 2015-10-03 DIAGNOSIS — R5383 Other fatigue: Secondary | ICD-10-CM | POA: Diagnosis not present

## 2015-11-23 DIAGNOSIS — E119 Type 2 diabetes mellitus without complications: Secondary | ICD-10-CM | POA: Diagnosis not present

## 2015-12-05 DIAGNOSIS — K641 Second degree hemorrhoids: Secondary | ICD-10-CM | POA: Diagnosis not present

## 2015-12-05 DIAGNOSIS — K625 Hemorrhage of anus and rectum: Secondary | ICD-10-CM | POA: Diagnosis not present

## 2015-12-05 DIAGNOSIS — K219 Gastro-esophageal reflux disease without esophagitis: Secondary | ICD-10-CM | POA: Diagnosis not present

## 2015-12-05 DIAGNOSIS — K5730 Diverticulosis of large intestine without perforation or abscess without bleeding: Secondary | ICD-10-CM | POA: Diagnosis not present

## 2015-12-11 ENCOUNTER — Encounter: Payer: Self-pay | Admitting: Gastroenterology

## 2015-12-16 DIAGNOSIS — J0110 Acute frontal sinusitis, unspecified: Secondary | ICD-10-CM | POA: Diagnosis not present

## 2015-12-21 DIAGNOSIS — K625 Hemorrhage of anus and rectum: Secondary | ICD-10-CM | POA: Diagnosis not present

## 2015-12-21 DIAGNOSIS — K5900 Constipation, unspecified: Secondary | ICD-10-CM | POA: Diagnosis not present

## 2015-12-21 DIAGNOSIS — K5730 Diverticulosis of large intestine without perforation or abscess without bleeding: Secondary | ICD-10-CM | POA: Diagnosis not present

## 2015-12-21 DIAGNOSIS — K219 Gastro-esophageal reflux disease without esophagitis: Secondary | ICD-10-CM | POA: Diagnosis not present

## 2016-01-11 DIAGNOSIS — K589 Irritable bowel syndrome without diarrhea: Secondary | ICD-10-CM | POA: Diagnosis not present

## 2016-01-11 DIAGNOSIS — M7062 Trochanteric bursitis, left hip: Secondary | ICD-10-CM | POA: Diagnosis not present

## 2016-01-11 DIAGNOSIS — E119 Type 2 diabetes mellitus without complications: Secondary | ICD-10-CM | POA: Diagnosis not present

## 2016-03-04 DIAGNOSIS — E119 Type 2 diabetes mellitus without complications: Secondary | ICD-10-CM | POA: Diagnosis not present

## 2016-03-06 DIAGNOSIS — H5203 Hypermetropia, bilateral: Secondary | ICD-10-CM | POA: Diagnosis not present

## 2016-03-06 DIAGNOSIS — H2513 Age-related nuclear cataract, bilateral: Secondary | ICD-10-CM | POA: Diagnosis not present

## 2016-03-06 DIAGNOSIS — E119 Type 2 diabetes mellitus without complications: Secondary | ICD-10-CM | POA: Diagnosis not present

## 2016-03-08 DIAGNOSIS — B351 Tinea unguium: Secondary | ICD-10-CM | POA: Diagnosis not present

## 2016-03-08 DIAGNOSIS — L603 Nail dystrophy: Secondary | ICD-10-CM | POA: Diagnosis not present

## 2016-03-08 DIAGNOSIS — M79674 Pain in right toe(s): Secondary | ICD-10-CM | POA: Diagnosis not present

## 2016-03-11 DIAGNOSIS — M5430 Sciatica, unspecified side: Secondary | ICD-10-CM | POA: Diagnosis not present

## 2016-03-11 DIAGNOSIS — M51369 Other intervertebral disc degeneration, lumbar region without mention of lumbar back pain or lower extremity pain: Secondary | ICD-10-CM | POA: Insufficient documentation

## 2016-03-11 DIAGNOSIS — M5136 Other intervertebral disc degeneration, lumbar region: Secondary | ICD-10-CM | POA: Insufficient documentation

## 2016-03-11 DIAGNOSIS — E119 Type 2 diabetes mellitus without complications: Secondary | ICD-10-CM | POA: Diagnosis not present

## 2016-03-29 DIAGNOSIS — B351 Tinea unguium: Secondary | ICD-10-CM | POA: Diagnosis not present

## 2016-05-16 DIAGNOSIS — G5702 Lesion of sciatic nerve, left lower limb: Secondary | ICD-10-CM | POA: Diagnosis not present

## 2016-05-21 ENCOUNTER — Other Ambulatory Visit: Payer: Self-pay | Admitting: Internal Medicine

## 2016-05-21 DIAGNOSIS — Z1231 Encounter for screening mammogram for malignant neoplasm of breast: Secondary | ICD-10-CM

## 2016-05-27 ENCOUNTER — Ambulatory Visit
Admission: RE | Admit: 2016-05-27 | Discharge: 2016-05-27 | Disposition: A | Discharge status: Home / Self Care | Payer: PPO | Source: Ambulatory Visit | Attending: Internal Medicine | Admitting: Internal Medicine

## 2016-05-27 DIAGNOSIS — Z1231 Encounter for screening mammogram for malignant neoplasm of breast: Secondary | ICD-10-CM

## 2016-07-10 DIAGNOSIS — E538 Deficiency of other specified B group vitamins: Secondary | ICD-10-CM | POA: Diagnosis not present

## 2016-07-10 DIAGNOSIS — E119 Type 2 diabetes mellitus without complications: Secondary | ICD-10-CM | POA: Diagnosis not present

## 2016-07-17 ENCOUNTER — Other Ambulatory Visit: Payer: Self-pay | Admitting: Internal Medicine

## 2016-07-17 DIAGNOSIS — M5116 Intervertebral disc disorders with radiculopathy, lumbar region: Secondary | ICD-10-CM | POA: Diagnosis not present

## 2016-07-17 DIAGNOSIS — Z0000 Encounter for general adult medical examination without abnormal findings: Secondary | ICD-10-CM | POA: Diagnosis not present

## 2016-07-17 DIAGNOSIS — E119 Type 2 diabetes mellitus without complications: Secondary | ICD-10-CM | POA: Diagnosis not present

## 2016-07-17 DIAGNOSIS — N951 Menopausal and female climacteric states: Secondary | ICD-10-CM | POA: Diagnosis not present

## 2016-07-25 DIAGNOSIS — N951 Menopausal and female climacteric states: Secondary | ICD-10-CM | POA: Diagnosis not present

## 2016-07-26 ENCOUNTER — Ambulatory Visit: Payer: PPO

## 2016-07-29 ENCOUNTER — Ambulatory Visit: Payer: PPO

## 2016-08-06 ENCOUNTER — Ambulatory Visit
Admission: RE | Admit: 2016-08-06 | Discharge: 2016-08-06 | Disposition: A | Discharge status: Home / Self Care | Payer: PPO | Source: Ambulatory Visit | Attending: Internal Medicine | Admitting: Internal Medicine

## 2016-08-06 DIAGNOSIS — M48061 Spinal stenosis, lumbar region without neurogenic claudication: Secondary | ICD-10-CM | POA: Insufficient documentation

## 2016-08-06 DIAGNOSIS — M5116 Intervertebral disc disorders with radiculopathy, lumbar region: Secondary | ICD-10-CM

## 2016-08-06 DIAGNOSIS — M545 Low back pain: Secondary | ICD-10-CM | POA: Diagnosis not present

## 2016-08-13 ENCOUNTER — Emergency Department (HOSPITAL_COMMUNITY)
Admission: EM | Admit: 2016-08-13 | Discharge: 2016-08-13 | Disposition: A | Discharge status: Home / Self Care | Payer: PPO | Attending: Emergency Medicine | Admitting: Emergency Medicine

## 2016-08-13 ENCOUNTER — Encounter (HOSPITAL_COMMUNITY): Payer: Self-pay | Admitting: Emergency Medicine

## 2016-08-13 DIAGNOSIS — E119 Type 2 diabetes mellitus without complications: Secondary | ICD-10-CM | POA: Insufficient documentation

## 2016-08-13 DIAGNOSIS — Z7982 Long term (current) use of aspirin: Secondary | ICD-10-CM | POA: Insufficient documentation

## 2016-08-13 DIAGNOSIS — R197 Diarrhea, unspecified: Secondary | ICD-10-CM | POA: Diagnosis not present

## 2016-08-13 DIAGNOSIS — I10 Essential (primary) hypertension: Secondary | ICD-10-CM | POA: Diagnosis not present

## 2016-08-13 DIAGNOSIS — K529 Noninfective gastroenteritis and colitis, unspecified: Secondary | ICD-10-CM

## 2016-08-13 DIAGNOSIS — Z79899 Other long term (current) drug therapy: Secondary | ICD-10-CM | POA: Diagnosis not present

## 2016-08-13 LAB — COMPREHENSIVE METABOLIC PANEL
ALT: 22 U/L (ref 14–54)
AST: 26 U/L (ref 15–41)
Albumin: 4.2 g/dL (ref 3.5–5.0)
Alkaline Phosphatase: 68 U/L (ref 38–126)
Anion gap: 11 (ref 5–15)
BUN: 14 mg/dL (ref 6–20)
CHLORIDE: 105 mmol/L (ref 101–111)
CO2: 24 mmol/L (ref 22–32)
CREATININE: 0.91 mg/dL (ref 0.44–1.00)
Calcium: 9 mg/dL (ref 8.9–10.3)
Glucose, Bld: 143 mg/dL — ABNORMAL HIGH (ref 65–99)
POTASSIUM: 3.6 mmol/L (ref 3.5–5.1)
Sodium: 140 mmol/L (ref 135–145)
TOTAL PROTEIN: 7.8 g/dL (ref 6.5–8.1)
Total Bilirubin: 0.7 mg/dL (ref 0.3–1.2)

## 2016-08-13 LAB — LIPASE, BLOOD: LIPASE: 21 U/L (ref 11–51)

## 2016-08-13 LAB — CBC
HCT: 43.4 % (ref 36.0–46.0)
Hemoglobin: 14 g/dL (ref 12.0–15.0)
MCH: 27.6 pg (ref 26.0–34.0)
MCHC: 32.3 g/dL (ref 30.0–36.0)
MCV: 85.6 fL (ref 78.0–100.0)
PLATELETS: 378 10*3/uL (ref 150–400)
RBC: 5.07 MIL/uL (ref 3.87–5.11)
RDW: 14.8 % (ref 11.5–15.5)
WBC: 13.2 10*3/uL — AB (ref 4.0–10.5)

## 2016-08-13 LAB — URINALYSIS, ROUTINE W REFLEX MICROSCOPIC
Bacteria, UA: NONE SEEN
GLUCOSE, UA: NEGATIVE mg/dL
HGB URINE DIPSTICK: NEGATIVE
Ketones, ur: 5 mg/dL — AB
NITRITE: NEGATIVE
Protein, ur: NEGATIVE mg/dL
RBC / HPF: NONE SEEN RBC/hpf (ref 0–5)
SPECIFIC GRAVITY, URINE: 1.028 (ref 1.005–1.030)
WBC, UA: NONE SEEN WBC/hpf (ref 0–5)
pH: 5 (ref 5.0–8.0)

## 2016-08-13 MED ORDER — SODIUM CHLORIDE 0.9 % IV BOLUS (SEPSIS)
2000.0000 mL | Freq: Once | INTRAVENOUS | Status: AC
  Administered 2016-08-13: 2000 mL via INTRAVENOUS

## 2016-08-13 MED ORDER — DIPHENHYDRAMINE HCL 50 MG/ML IJ SOLN
12.5000 mg | Freq: Once | INTRAMUSCULAR | Status: AC
  Administered 2016-08-13: 12.5 mg via INTRAVENOUS
  Filled 2016-08-13: qty 1

## 2016-08-13 MED ORDER — OXYCODONE-ACETAMINOPHEN 5-325 MG PO TABS
1.0000 | ORAL_TABLET | Freq: Once | ORAL | Status: AC
  Administered 2016-08-13: 1 via ORAL
  Filled 2016-08-13: qty 1

## 2016-08-13 MED ORDER — ONDANSETRON 8 MG PO TBDP: 8.0000 mg | ORAL_TABLET | Freq: Three times a day (TID) | ORAL | 0 refills | Status: DC | PRN

## 2016-08-13 MED ORDER — SODIUM CHLORIDE 0.9 % IV SOLN
INTRAVENOUS | Status: DC
  Administered 2016-08-13: 22:00:00 via INTRAVENOUS

## 2016-08-13 MED ORDER — DIPHENOXYLATE-ATROPINE 2.5-0.025 MG PO TABS: 2.0000 | ORAL_TABLET | Freq: Four times a day (QID) | ORAL | 0 refills | Status: DC | PRN

## 2016-08-13 MED ORDER — METOCLOPRAMIDE HCL 5 MG/ML IJ SOLN
5.0000 mg | Freq: Once | INTRAMUSCULAR | Status: AC
  Administered 2016-08-13: 5 mg via INTRAVENOUS
  Filled 2016-08-13: qty 2

## 2016-08-13 NOTE — ED Triage Notes (Signed)
Pt reports abd pain , emesis and diarrhea. Started last night . Alert and oriented x 4. Sent from PCP for IV fluids . Had suppository prior to arrival for vomiting per pt.

## 2016-08-13 NOTE — ED Provider Notes (Signed)
[place] DEPT Provider Note   CSN: [id number] Arrival date & time: 08/13/16  1148     History   Chief Complaint Chief Complaint  Patient presents with  . Emesis  . Diarrhea    HPI Nicole Garcia is a 70 y.o. female.  70 year old female presents with acute onset of nonbilious emesis, watery diarrhea began last night. Has had some abdominal crampy without actual pain. No fever or chills. Denies any cough or congestion. No sore throat or ear pain. Used Phenergan home with slight relief. Also became very anxious and took a dose of her Xanax. Denies any sick exposures. No recent antibiotic use.      Past Medical History:  Diagnosis Date  . Concussion   . Depression   . Diabetes mellitus without complication (HCC)    diet controlled  . Diverticulitis   . GERD (gastroesophageal reflux disease)   . History of chicken pox   . Hyperglycemia   . Hypertension     Patient Active Problem List   Diagnosis Date Noted  . Vaginal lesion 05/08/2015  . Paronychia, toe 04/06/2015  . Renal insufficiency 04/06/2015  . Diverticulitis 03/22/2015  . Screening for breast cancer 03/22/2015  . Obesity (BMI 30-39.9) 03/22/2015  . Muscle spasm 01/24/2015  . Diabetes mellitus, type 2 ([place]) 01/11/2015  . Adjustment disorder with mixed anxiety and depressed mood 12/29/2007  . Essential hypertension 12/29/2007  . REFLUX ESOPHAGITIS 12/15/2007  . IRRITABLE BOWEL SYNDROME 12/15/2007    Past Surgical History:  Procedure Laterality Date  . ABDOMINAL HYSTERECTOMY    . BLADDER SUSPENSION  1997   microinvasive Protogen  . BREAST SURGERY     biopsy 1966 and 2000  . CHOLECYSTECTOMY    . ROOT CANAL    . TOE SURGERY    . VAGINAL DELIVERY     x2    OB History    No data available       Home Medications    Prior to Admission medications   Medication Sig Start Date End Date Taking? Authorizing Provider  ALPRAZolam Duanne Moron) 0.5 MG tablet Take 0.5 mg by mouth 2 (two) times daily as  needed for anxiety.     Historical Provider, MD  aspirin 81 MG tablet Take 81 mg by mouth daily.    Historical Provider, MD  cholecalciferol (VITAMIN D) 1000 UNITS tablet Take 1,000 Units by mouth daily.    Historical Provider, MD  docusate sodium (COLACE) 100 MG capsule Take 100 mg by mouth daily.    Historical Provider, MD  esomeprazole (NEXIUM) 20 MG capsule Take 20 mg by mouth daily at 12 noon.    Historical Provider, MD  furosemide (LASIX) 20 MG tablet Take 20 mg by mouth as needed.     Historical Provider, MD  hydrochlorothiazide (HYDRODIURIL) 25 MG tablet Take 25 mg by mouth as needed.    Historical Provider, MD  magnesium oxide (MAG-OX) 400 MG tablet Take 400 mg by mouth daily.    Historical Provider, MD  metoprolol tartrate (LOPRESSOR) 25 MG tablet Take 1 tablet (25 mg total) by mouth 2 (two) times daily. Patient taking differently: Take by mouth daily.  09/14/14   Wellington Hampshire, MD  omeprazole (PRILOSEC) 40 MG capsule Take 1 capsule (40 mg total) by mouth daily. 04/06/15   Jackolyn Confer, MD    Family History Family History  Problem Relation Age of Onset  . Diabetes Father   . Cancer Sister 19    pancreatic  . Cancer  Brother 67    brain  . Cancer Sister     lung    Social History Social History  Substance Use Topics  . Smoking status: Never Smoker  . Smokeless tobacco: Never Used  . Alcohol use No     Allergies   Iohexol; Bactroban [mupirocin calcium]; Ciprofloxacin; Codeine; Levofloxacin; Metformin and related; Nitrofurantoin; and Sulfonamide derivatives   Review of Systems Review of Systems  All other systems reviewed and are negative.    Physical Exam Updated Vital Signs BP 142/78   Pulse 77   Temp 99 F (37.2 C) (Oral)   Resp 16   SpO2 99%   Physical Exam  Constitutional: She is oriented to person, place, and time. She appears well-developed and well-nourished.  Non-toxic appearance. No distress.  HENT:  Head: Normocephalic and atraumatic.    Eyes: Conjunctivae, EOM and lids are normal. Pupils are equal, round, and reactive to light.  Neck: Normal range of motion. Neck supple. No tracheal deviation present. No thyroid mass present.  Cardiovascular: Normal rate, regular rhythm and normal heart sounds.  Exam reveals no gallop.   No murmur heard. Pulmonary/Chest: Effort normal and breath sounds normal. No stridor. No respiratory distress. She has no decreased breath sounds. She has no wheezes. She has no rhonchi. She has no rales.  Abdominal: Soft. Normal appearance and bowel sounds are normal. She exhibits no distension. There is no tenderness. There is no rebound and no CVA tenderness.  Musculoskeletal: Normal range of motion. She exhibits no edema or tenderness.  Neurological: She is alert and oriented to person, place, and time. She has normal strength. No cranial nerve deficit or sensory deficit. GCS eye subscore is 4. GCS verbal subscore is 5. GCS motor subscore is 6.  Skin: Skin is warm and dry. No abrasion and no rash noted.  Psychiatric: She has a normal mood and affect. Her speech is normal and behavior is normal.  Nursing note and vitals reviewed.    ED Treatments / Results  Labs (all labs ordered are listed, but only abnormal results are displayed) Labs Reviewed  COMPREHENSIVE METABOLIC PANEL - Abnormal; Notable for the following:       Result Value   Glucose, Bld 143 (*)    All other components within normal limits  CBC - Abnormal; Notable for the following:    WBC 13.2 (*)    All other components within normal limits  URINALYSIS, ROUTINE W REFLEX MICROSCOPIC - Abnormal; Notable for the following:    APPearance TURBID (*)    Bilirubin Urine SMALL (*)    Ketones, ur 5 (*)    Leukocytes, UA SMALL (*)    Squamous Epithelial / LPF 0-5 (*)    All other components within normal limits  LIPASE, BLOOD    EKG  EKG Interpretation None       Radiology No results found.  Procedures Procedures (including  critical care time)  Medications Ordered in ED Medications  sodium chloride 0.9 % bolus 2,000 mL (not administered)  0.9 %  sodium chloride infusion (not administered)  metoCLOPramide (REGLAN) injection 5 mg (not administered)  diphenhydrAMINE (BENADRYL) injection 12.5 mg (not administered)     Initial Impression / Assessment and Plan / ED Course  I have reviewed the triage vital signs and the nursing notes.  Pertinent labs & imaging results that were available during my care of the patient were reviewed by me and considered in my medical decision making (see chart for details).  Clinical Course  Patient given IV fluids here and her headache was treated. Suspect that she has gastroenteritis. Stable for discharge to home  Final Clinical Impressions(s) / ED Diagnoses   Final diagnoses:  None    New Prescriptions New Prescriptions   No medications on file     Lacretia Leigh, MD 08/13/16 2222

## 2016-08-17 DIAGNOSIS — R51 Headache: Secondary | ICD-10-CM | POA: Diagnosis not present

## 2016-08-17 DIAGNOSIS — E860 Dehydration: Secondary | ICD-10-CM | POA: Diagnosis not present

## 2016-09-12 DIAGNOSIS — M5136 Other intervertebral disc degeneration, lumbar region: Secondary | ICD-10-CM | POA: Diagnosis not present

## 2016-09-12 DIAGNOSIS — M48062 Spinal stenosis, lumbar region with neurogenic claudication: Secondary | ICD-10-CM | POA: Diagnosis not present

## 2016-09-12 DIAGNOSIS — M5416 Radiculopathy, lumbar region: Secondary | ICD-10-CM | POA: Diagnosis not present

## 2016-10-15 DIAGNOSIS — M5116 Intervertebral disc disorders with radiculopathy, lumbar region: Secondary | ICD-10-CM | POA: Diagnosis not present

## 2016-10-15 DIAGNOSIS — Z0000 Encounter for general adult medical examination without abnormal findings: Secondary | ICD-10-CM | POA: Diagnosis not present

## 2016-10-18 DIAGNOSIS — L400 Psoriasis vulgaris: Secondary | ICD-10-CM | POA: Diagnosis not present

## 2016-10-21 DIAGNOSIS — M9903 Segmental and somatic dysfunction of lumbar region: Secondary | ICD-10-CM | POA: Diagnosis not present

## 2016-10-21 DIAGNOSIS — M5117 Intervertebral disc disorders with radiculopathy, lumbosacral region: Secondary | ICD-10-CM | POA: Diagnosis not present

## 2016-10-21 DIAGNOSIS — M62830 Muscle spasm of back: Secondary | ICD-10-CM | POA: Diagnosis not present

## 2016-10-21 DIAGNOSIS — M5432 Sciatica, left side: Secondary | ICD-10-CM | POA: Diagnosis not present

## 2016-10-23 DIAGNOSIS — M62830 Muscle spasm of back: Secondary | ICD-10-CM | POA: Diagnosis not present

## 2016-10-23 DIAGNOSIS — M9903 Segmental and somatic dysfunction of lumbar region: Secondary | ICD-10-CM | POA: Diagnosis not present

## 2016-10-23 DIAGNOSIS — M5117 Intervertebral disc disorders with radiculopathy, lumbosacral region: Secondary | ICD-10-CM | POA: Diagnosis not present

## 2016-10-23 DIAGNOSIS — M5432 Sciatica, left side: Secondary | ICD-10-CM | POA: Diagnosis not present

## 2016-10-23 DIAGNOSIS — M5416 Radiculopathy, lumbar region: Secondary | ICD-10-CM | POA: Diagnosis not present

## 2016-10-31 DIAGNOSIS — M5416 Radiculopathy, lumbar region: Secondary | ICD-10-CM | POA: Diagnosis not present

## 2016-11-04 DIAGNOSIS — M5416 Radiculopathy, lumbar region: Secondary | ICD-10-CM | POA: Diagnosis not present

## 2016-11-20 DIAGNOSIS — M5416 Radiculopathy, lumbar region: Secondary | ICD-10-CM | POA: Diagnosis not present

## 2016-11-25 DIAGNOSIS — M5416 Radiculopathy, lumbar region: Secondary | ICD-10-CM | POA: Diagnosis not present

## 2016-11-29 DIAGNOSIS — M5416 Radiculopathy, lumbar region: Secondary | ICD-10-CM | POA: Diagnosis not present

## 2016-12-02 DIAGNOSIS — M5416 Radiculopathy, lumbar region: Secondary | ICD-10-CM | POA: Diagnosis not present

## 2016-12-04 DIAGNOSIS — M5416 Radiculopathy, lumbar region: Secondary | ICD-10-CM | POA: Diagnosis not present

## 2016-12-09 DIAGNOSIS — M5416 Radiculopathy, lumbar region: Secondary | ICD-10-CM | POA: Diagnosis not present

## 2016-12-13 DIAGNOSIS — M5416 Radiculopathy, lumbar region: Secondary | ICD-10-CM | POA: Diagnosis not present

## 2016-12-16 DIAGNOSIS — M5416 Radiculopathy, lumbar region: Secondary | ICD-10-CM | POA: Diagnosis not present

## 2016-12-19 DIAGNOSIS — M5416 Radiculopathy, lumbar region: Secondary | ICD-10-CM | POA: Diagnosis not present

## 2016-12-24 DIAGNOSIS — M5416 Radiculopathy, lumbar region: Secondary | ICD-10-CM | POA: Diagnosis not present

## 2016-12-26 DIAGNOSIS — M5416 Radiculopathy, lumbar region: Secondary | ICD-10-CM | POA: Diagnosis not present

## 2017-01-03 DIAGNOSIS — M5416 Radiculopathy, lumbar region: Secondary | ICD-10-CM | POA: Diagnosis not present

## 2017-01-06 DIAGNOSIS — M5416 Radiculopathy, lumbar region: Secondary | ICD-10-CM | POA: Diagnosis not present

## 2017-01-07 DIAGNOSIS — E119 Type 2 diabetes mellitus without complications: Secondary | ICD-10-CM | POA: Diagnosis not present

## 2017-01-08 DIAGNOSIS — S134XXA Sprain of ligaments of cervical spine, initial encounter: Secondary | ICD-10-CM | POA: Diagnosis not present

## 2017-01-08 DIAGNOSIS — Z885 Allergy status to narcotic agent status: Secondary | ICD-10-CM | POA: Diagnosis not present

## 2017-01-08 DIAGNOSIS — S5011XA Contusion of right forearm, initial encounter: Secondary | ICD-10-CM | POA: Diagnosis not present

## 2017-01-08 DIAGNOSIS — S0990XA Unspecified injury of head, initial encounter: Secondary | ICD-10-CM | POA: Diagnosis not present

## 2017-01-08 DIAGNOSIS — S060X9A Concussion with loss of consciousness of unspecified duration, initial encounter: Secondary | ICD-10-CM | POA: Diagnosis not present

## 2017-01-08 DIAGNOSIS — E119 Type 2 diabetes mellitus without complications: Secondary | ICD-10-CM | POA: Diagnosis not present

## 2017-01-08 DIAGNOSIS — Z9049 Acquired absence of other specified parts of digestive tract: Secondary | ICD-10-CM | POA: Diagnosis not present

## 2017-01-08 DIAGNOSIS — Z743 Need for continuous supervision: Secondary | ICD-10-CM | POA: Diagnosis not present

## 2017-01-08 DIAGNOSIS — R52 Pain, unspecified: Secondary | ICD-10-CM | POA: Diagnosis not present

## 2017-01-08 DIAGNOSIS — Z90710 Acquired absence of both cervix and uterus: Secondary | ICD-10-CM | POA: Diagnosis not present

## 2017-01-08 DIAGNOSIS — Y92410 Unspecified street and highway as the place of occurrence of the external cause: Secondary | ICD-10-CM | POA: Diagnosis not present

## 2017-01-08 DIAGNOSIS — I10 Essential (primary) hypertension: Secondary | ICD-10-CM | POA: Diagnosis not present

## 2017-01-08 DIAGNOSIS — Z882 Allergy status to sulfonamides status: Secondary | ICD-10-CM | POA: Diagnosis not present

## 2017-01-14 DIAGNOSIS — Z79899 Other long term (current) drug therapy: Secondary | ICD-10-CM | POA: Diagnosis not present

## 2017-01-14 DIAGNOSIS — S3993XA Unspecified injury of pelvis, initial encounter: Secondary | ICD-10-CM | POA: Diagnosis not present

## 2017-01-14 DIAGNOSIS — R102 Pelvic and perineal pain: Secondary | ICD-10-CM | POA: Diagnosis not present

## 2017-01-14 DIAGNOSIS — E119 Type 2 diabetes mellitus without complications: Secondary | ICD-10-CM | POA: Diagnosis not present

## 2017-01-14 DIAGNOSIS — M5136 Other intervertebral disc degeneration, lumbar region: Secondary | ICD-10-CM | POA: Diagnosis not present

## 2017-01-29 DIAGNOSIS — G5601 Carpal tunnel syndrome, right upper limb: Secondary | ICD-10-CM | POA: Diagnosis not present

## 2017-01-29 DIAGNOSIS — M5416 Radiculopathy, lumbar region: Secondary | ICD-10-CM | POA: Diagnosis not present

## 2017-01-29 DIAGNOSIS — S4991XA Unspecified injury of right shoulder and upper arm, initial encounter: Secondary | ICD-10-CM | POA: Diagnosis not present

## 2017-01-29 DIAGNOSIS — M7711 Lateral epicondylitis, right elbow: Secondary | ICD-10-CM | POA: Diagnosis not present

## 2017-02-04 DIAGNOSIS — M5416 Radiculopathy, lumbar region: Secondary | ICD-10-CM | POA: Diagnosis not present

## 2017-02-05 DIAGNOSIS — G8929 Other chronic pain: Secondary | ICD-10-CM | POA: Diagnosis not present

## 2017-02-05 DIAGNOSIS — M545 Low back pain: Secondary | ICD-10-CM | POA: Diagnosis not present

## 2017-02-06 DIAGNOSIS — M5416 Radiculopathy, lumbar region: Secondary | ICD-10-CM | POA: Diagnosis not present

## 2017-07-21 DIAGNOSIS — E119 Type 2 diabetes mellitus without complications: Secondary | ICD-10-CM | POA: Diagnosis not present

## 2017-07-21 DIAGNOSIS — Z79899 Other long term (current) drug therapy: Secondary | ICD-10-CM | POA: Diagnosis not present

## 2017-07-23 DIAGNOSIS — Z0000 Encounter for general adult medical examination without abnormal findings: Secondary | ICD-10-CM | POA: Diagnosis not present

## 2017-07-23 DIAGNOSIS — M5136 Other intervertebral disc degeneration, lumbar region: Secondary | ICD-10-CM | POA: Diagnosis not present

## 2017-07-23 DIAGNOSIS — E119 Type 2 diabetes mellitus without complications: Secondary | ICD-10-CM | POA: Diagnosis not present

## 2017-08-04 DIAGNOSIS — H5203 Hypermetropia, bilateral: Secondary | ICD-10-CM | POA: Diagnosis not present

## 2017-08-04 DIAGNOSIS — H2513 Age-related nuclear cataract, bilateral: Secondary | ICD-10-CM | POA: Diagnosis not present

## 2017-08-04 DIAGNOSIS — E119 Type 2 diabetes mellitus without complications: Secondary | ICD-10-CM | POA: Diagnosis not present

## 2017-08-06 ENCOUNTER — Other Ambulatory Visit: Payer: Self-pay | Admitting: Internal Medicine

## 2017-08-06 DIAGNOSIS — Z1231 Encounter for screening mammogram for malignant neoplasm of breast: Secondary | ICD-10-CM

## 2017-09-02 ENCOUNTER — Telehealth: Payer: Self-pay | Admitting: Cardiovascular Disease

## 2017-09-02 NOTE — Telephone Encounter (Signed)
S/w patient. She flew to [place] last week for a cruise and during descent on airplane, patient felt shortness of breath. She said it did not happen on the way back from her trip when on the flight. She also had an episode of shortness of breath during the night last night lasting for several minutes.  Denies chest pain, nausea, vomiting, dizziness, or palpitations. HR was 108. She did not take her blood pressure. States today she had the shortness of breath again while driving. She last saw Dr Fletcher Anon 09/2015 and f/u was prn.  She has not had these issues much again until recently. Patient scheduled to see Dr Fletcher Anon on 09/05/17 at 11:20am. Patient aware to arrive 15 minutes early.

## 2017-09-02 NOTE — Telephone Encounter (Signed)
Pt c/o Shortness Of Breath: STAT if SOB developed within the last 24 hours or pt is noticeably SOB on the phone  1. Are you currently SOB (can you hear that pt is SOB on the phone)? No   2. How long have you been experiencing SOB? Last night  episode last week was on a cruise and had interim sob   3. Are you SOB when sitting or when up moving around? Sleeping - woke up with sob   4. Are you currently experiencing any other symptoms? HR elevated above 100 Headache sharpe pain in head

## 2017-09-05 ENCOUNTER — Telehealth: Payer: Self-pay | Admitting: Cardiovascular Disease

## 2017-09-05 ENCOUNTER — Other Ambulatory Visit: Payer: Self-pay | Admitting: Cardiovascular Disease

## 2017-09-05 ENCOUNTER — Ambulatory Visit (INDEPENDENT_AMBULATORY_CARE_PROVIDER_SITE_OTHER): Payer: PPO

## 2017-09-05 ENCOUNTER — Other Ambulatory Visit
Admission: RE | Admit: 2017-09-05 | Discharge: 2017-09-05 | Disposition: A | Discharge status: Home / Self Care | Payer: PPO | Source: Ambulatory Visit | Attending: Cardiovascular Disease | Admitting: Cardiovascular Disease

## 2017-09-05 ENCOUNTER — Encounter: Payer: Self-pay | Admitting: Cardiovascular Disease

## 2017-09-05 ENCOUNTER — Other Ambulatory Visit: Payer: Self-pay

## 2017-09-05 ENCOUNTER — Ambulatory Visit: Payer: PPO | Admitting: Cardiovascular Disease

## 2017-09-05 VITALS — BP 118/64 | HR 80 | Ht <= 58 in | Wt 189.0 lb

## 2017-09-05 DIAGNOSIS — R0602 Shortness of breath: Secondary | ICD-10-CM

## 2017-09-05 DIAGNOSIS — M79605 Pain in left leg: Secondary | ICD-10-CM | POA: Diagnosis not present

## 2017-09-05 DIAGNOSIS — R0600 Dyspnea, unspecified: Secondary | ICD-10-CM

## 2017-09-05 DIAGNOSIS — I10 Essential (primary) hypertension: Secondary | ICD-10-CM | POA: Diagnosis not present

## 2017-09-05 DIAGNOSIS — R7989 Other specified abnormal findings of blood chemistry: Secondary | ICD-10-CM

## 2017-09-05 LAB — BRAIN NATRIURETIC PEPTIDE: B Natriuretic Peptide: 11 pg/mL (ref 0.0–100.0)

## 2017-09-05 LAB — FIBRIN DERIVATIVES D-DIMER (ARMC ONLY): FIBRIN DERIVATIVES D-DIMER (ARMC): 835.05 ng{FEU}/mL — AB (ref 0.00–499.00)

## 2017-09-05 NOTE — Patient Instructions (Addendum)
Medication Instructions:  Your physician recommends that you continue on your current medications as directed. Please refer to the Current Medication list given to you today.   Labwork: D-dimer and BNP at the [facility] lab today.   Testing/Procedures: Your physician has requested that you have an echocardiogram. Echocardiography is a painless test that uses sound waves to create images of your heart. It provides your doctor with information about the size and shape of your heart and how well your heart's chambers and valves are working. This procedure takes approximately one hour. There are no restrictions for this procedure.  Your physician has requested that you have an exercise tolerance test. For further information please visit [email or web address]. Please also follow instruction sheet, as given. Wear comfortable walking shoes (ie., sneakers) No caffeine or smoking 24 hours before your test.    Your physician has requested that you have a lower extremity venous duplex. This test is an ultrasound of the veins in your left g. It looks at venous blood flow that carries blood from the heart to the legs or arms. Allow one hour for a Lower Venous exam.  There are no restrictions or special instructions. Today at 4pm, [facility].    Follow-Up: Your physician recommends that you schedule a follow-up appointment as needed.    Any Other Special Instructions Will Be Listed Below (If Applicable).     If you need a refill on your cardiac medications before your next appointment, please call your pharmacy.  Echocardiogram An echocardiogram, or echocardiography, uses sound waves (ultrasound) to produce an image of your heart. The echocardiogram is simple, painless, obtained within a short period of time, and offers valuable information to your health care provider. The images from an echocardiogram can provide information such as:  Evidence of coronary artery disease (CAD).  Heart  size.  Heart muscle function.  Heart valve function.  Aneurysm detection.  Evidence of a past heart attack.  Fluid buildup around the heart.  Heart muscle thickening.  Assess heart valve function.  Tell a health care provider about:  Any allergies you have.  All medicines you are taking, including vitamins, herbs, eye drops, creams, and over-the-counter medicines.  Any problems you or family members have had with anesthetic medicines.  Any blood disorders you have.  Any surgeries you have had.  Any medical conditions you have.  Whether you are pregnant or may be pregnant. What happens before the procedure? No special preparation is needed. Eat and drink normally. What happens during the procedure?  In order to produce an image of your heart, gel will be applied to your chest and a wand-like tool (transducer) will be moved over your chest. The gel will help transmit the sound waves from the transducer. The sound waves will harmlessly bounce off your heart to allow the heart images to be captured in real-time motion. These images will then be recorded.  You may need an IV to receive a medicine that improves the quality of the pictures. What happens after the procedure? You may return to your normal schedule including diet, activities, and medicines, unless your health care provider tells you otherwise. This information is not intended to replace advice given to you by your health care provider. Make sure you discuss any questions you have with your health care provider. Document Released: 08/02/2000 Document Revised: 03/23/2016 Document Reviewed: 04/12/2013 Elsevier Interactive Patient Education  2017 [employer].  Exercise Stress Electrocardiogram An exercise stress electrocardiogram is a test to check  how blood flows to your heart. It is done to find areas of poor blood flow. You will need to walk on a treadmill for this test. The electrocardiogram will record your  heartbeat when you are at rest and when you are exercising. What happens before the procedure?  Do not have drinks with caffeine or foods with caffeine for 24 hours before the test, or as told by your doctor. This includes coffee, tea (even decaf tea), sodas, chocolate, and cocoa.  Follow your doctor's instructions about eating and drinking before the test.  Ask your doctor what medicines you should or should not take before the test. Take your medicines with water unless told by your doctor not to.  If you use an inhaler, bring it with you to the test.  Bring a snack to eat after the test.  Do not  smoke for 4 hours before the test.  Do not put lotions, powders, creams, or oils on your chest before the test.  Wear comfortable shoes and clothing. What happens during the procedure?  You will have patches put on your chest. Small areas of your chest may need to be shaved. Wires will be connected to the patches.  Your heart rate will be watched while you are resting and while you are exercising.  You will walk on the treadmill. The treadmill will slowly get faster to raise your heart rate.  The test will take about 1-2 hours. What happens after the procedure?  Your heart rate and blood pressure will be watched after the test.  You may return to your normal diet, activities, and medicines or as told by your doctor. This information is not intended to replace advice given to you by your health care provider. Make sure you discuss any questions you have with your health care provider. Document Released: 01/22/2008 Document Revised: 04/03/2016 Document Reviewed: 04/12/2013 Elsevier Interactive Patient Education  [employer].

## 2017-09-05 NOTE — Telephone Encounter (Signed)
S/w pt regarding elevated d-dimer. Per Dr. Fletcher Anon, pt needs VQ scan to rule out PE. Pt allergic to dye; can not have CT angio. S/w Manuela Schwartz in Scheduling.  Scheduled 1/19, 7:30am CXR followed by VQ Scan. Call report number [phone].  S/w pt who understands to go to the [facility] tomorrow for testing.  She will keep 4pm appt today for LE venous duplex. Dr. Fletcher Anon agreeable w/plan. Changed call report number to [phone] w/Kenisha

## 2017-09-05 NOTE — Progress Notes (Signed)
Cardiology Office Note   Date:  09/05/2017   ID:  Nicole Garcia, Nicole Garcia 1946-05-22, MRN [id number]  PCP:  Nicole Aus, MD  Cardiologist:   Kathlyn Sacramento, MD   Chief Complaint  Patient presents with  . other    C/o sob, flunctuating BP and edema left ankle. Mesd reviewed verbally with pt.      History of Present Illness: Nicole Garcia is a 72 y.o. female who presents for evaluation of shortness of breath and left leg swelling.  She was seen by me in 2016 for atypical chest pain and shortness of breath. A treadmill nuclear stress test in January 2016  showed no evidence of ischemia with normal ejection fraction. Echocardiogram was overall unremarkable except for mild diastolic dysfunction.  She was most recently seen in 2017 and was doing well at that time.  She has known history of hypertension, type 2 diabetes and GERD. She went on a cruise recently.  She flew to [place] and while on the airplane, she had episodes of shortness of breath of unclear etiology.  She denies anxiety.  She was able to continue the trip and went on the cruise.  She felt very tired with exertion but no chest pain.  She came back.  She woke up from sleep with sudden shortness of breath and could not go back to sleep.  She reports chronic left leg swelling which worsened after the trip.  No prior history of DVT or pulmonary embolism.   Past Medical History:  Diagnosis Date  . Concussion   . Depression   . Diabetes mellitus without complication (HCC)    diet controlled  . Diverticulitis   . GERD (gastroesophageal reflux disease)   . History of chicken pox   . Hyperglycemia   . Hypertension     Past Surgical History:  Procedure Laterality Date  . ABDOMINAL HYSTERECTOMY    . BLADDER SUSPENSION  1997   microinvasive Protogen  . BREAST SURGERY     biopsy 1966 and 2000  . CHOLECYSTECTOMY    . ROOT CANAL    . TOE SURGERY    . VAGINAL DELIVERY     x2     Current Outpatient Medications    Medication Sig Dispense Refill  . acetaminophen (TYLENOL) 500 MG tablet Take 1,000 mg by mouth every 6 (six) hours as needed for moderate pain.    Marland Kitchen ALPRAZolam (XANAX) 0.5 MG tablet Take 0.5 mg by mouth at bedtime.     . bisoprolol-hydrochlorothiazide (ZIAC) 2.5-6.25 MG tablet Take 0.5 tablets by mouth daily.    . cholecalciferol (VITAMIN D) 1000 UNITS tablet Take 1,000 Units by mouth daily.    Marland Kitchen dicyclomine (BENTYL) 20 MG tablet Take 20 mg by mouth as needed for spasms.    . diphenoxylate-atropine (LOMOTIL) 2.5-0.025 MG tablet Take 2 tablets by mouth 4 (four) times daily as needed for diarrhea or loose stools. 30 tablet 0  . docusate sodium (COLACE) 100 MG capsule Take 100 mg by mouth daily.    Marland Kitchen glimepiride (AMARYL) 1 MG tablet Take 0.5 mg by mouth 2 (two) times daily.    . hydrochlorothiazide (HYDRODIURIL) 25 MG tablet Take 25 mg by mouth daily as needed (fluid).     . meloxicam (MOBIC) 15 MG tablet Take 15 mg by mouth daily.    . metFORMIN (GLUCOPHAGE-XR) 500 MG 24 hr tablet Take 500 mg by mouth daily with breakfast.    . omeprazole (PRILOSEC) 40 MG capsule Take  1 capsule (40 mg total) by mouth daily. 90 capsule 3  . ondansetron (ZOFRAN ODT) 8 MG disintegrating tablet Take 1 tablet (8 mg total) by mouth every 8 (eight) hours as needed for nausea or vomiting. 20 tablet 0  . Probiotic Product (PROBIOTIC PO) Take 1 tablet by mouth daily.     No current facility-administered medications for this visit.     Allergies:   Iohexol; Bactroban [mupirocin calcium]; Ciprofloxacin; Codeine; Levofloxacin; Metformin and related; Nitrofurantoin; and Sulfonamide derivatives    Social History:  The patient  reports that  has never smoked. she has never used smokeless tobacco. She reports that she does not drink alcohol or use drugs.   Family History:  The patient's family history includes Cancer in her sister; Cancer (age of onset: 72) in her brother; Cancer (age of onset: 41) in her sister; Diabetes in  her father.    ROS:  Please see the history of present illness.   Otherwise, review of systems are positive for none.   All other systems are reviewed and negative.    PHYSICAL EXAM: VS:  BP 118/64 (BP Location: Left Arm, Patient Position: Sitting, Cuff Size: Normal)   Pulse 80   Ht 4\' 10"  (1.473 m)   Wt 189 lb (85.7 kg)   BMI 39.50 kg/m  , BMI Body mass index is 39.5 kg/m. GEN: Well nourished, well developed, in no acute distress  HEENT: normal  Neck: no JVD, carotid bruits, or masses Cardiac: RRR; no murmurs, rubs, or gallops.  Mild left leg swelling with no tenderness Respiratory:  clear to auscultation bilaterally, normal work of breathing GI: soft, nontender, nondistended, + BS MS: no deformity or atrophy  Skin: warm and dry, no rash Neuro:  Strength and sensation are intact Psych: euthymic mood, full affect   EKG:  EKG is ordered today. The ekg ordered today demonstrates normal sinus rhythm with low voltage   Recent Labs: No results found for requested labs within last 8760 hours.    Lipid Panel    Component Value Date/Time   CHOL 207 (H) 03/31/2015 1055   TRIG 383.0 (H) 03/31/2015 1055   HDL 37.70 (L) 03/31/2015 1055   CHOLHDL 5 03/31/2015 1055   VLDL 76.6 (H) 03/31/2015 1055   LDLDIRECT 101.0 03/31/2015 1055      Wt Readings from Last 3 Encounters:  09/05/17 189 lb (85.7 kg)  08/13/16 188 lb (85.3 kg)  10/02/15 193 lb 8 oz (87.8 kg)      No flowsheet data found.    ASSESSMENT AND PLAN:  1.  Left leg swelling: Worsened recently after travel.  I requested lower extremity venous duplex to exclude DVT.  2.  Shortness of breath: No chest pain.  Going to send her for BNP and d-dimer.  If abnormal, she will need CTA to evaluate for pulmonary embolism.  Clinically, that does not seem to be the case. Given recent worsening of exertional fatigue and shortness of breath, I requested a treadmill stress test and echocardiogram.  She has multiple risk factors  for coronary artery disease. Another possibility is sleep apnea given that the last episode woke her up from sleep.  3.  Essential hypertension: Blood pressure is controlled on current medications.    Disposition:   FU with me as needed.  Signed,  Kathlyn Sacramento, MD  09/05/2017 11:28 AM    [place]

## 2017-09-06 ENCOUNTER — Encounter
Admission: RE | Admit: 2017-09-06 | Discharge: 2017-09-06 | Disposition: A | Discharge status: Home / Self Care | Payer: PPO | Source: Ambulatory Visit | Attending: Cardiovascular Disease | Admitting: Cardiovascular Disease

## 2017-09-06 ENCOUNTER — Ambulatory Visit
Admission: RE | Admit: 2017-09-06 | Discharge: 2017-09-06 | Disposition: A | Discharge status: Home / Self Care | Payer: PPO | Source: Ambulatory Visit | Attending: Cardiovascular Disease | Admitting: Cardiovascular Disease

## 2017-09-06 DIAGNOSIS — R0602 Shortness of breath: Secondary | ICD-10-CM | POA: Insufficient documentation

## 2017-09-06 DIAGNOSIS — R7989 Other specified abnormal findings of blood chemistry: Secondary | ICD-10-CM | POA: Insufficient documentation

## 2017-09-06 MED ORDER — TECHNETIUM TO 99M ALBUMIN AGGREGATED
3.9210 | Freq: Once | INTRAVENOUS | Status: AC | PRN
  Administered 2017-09-06: 3.921 via INTRAVENOUS

## 2017-09-06 MED ORDER — TECHNETIUM TC 99M DIETHYLENETRIAME-PENTAACETIC ACID
32.9900 | Freq: Once | INTRAVENOUS | Status: AC | PRN
  Administered 2017-09-06: 32.99 via INTRAVENOUS

## 2017-09-08 NOTE — Addendum Note (Signed)
Addended by: Britt Bottom on: 09/08/2017 11:26 AM   Modules accepted: Orders

## 2017-09-10 ENCOUNTER — Telehealth: Payer: Self-pay | Admitting: *Deleted

## 2017-09-10 DIAGNOSIS — E119 Type 2 diabetes mellitus without complications: Secondary | ICD-10-CM | POA: Diagnosis not present

## 2017-09-10 DIAGNOSIS — J40 Bronchitis, not specified as acute or chronic: Secondary | ICD-10-CM | POA: Diagnosis not present

## 2017-09-10 DIAGNOSIS — R3989 Other symptoms and signs involving the genitourinary system: Secondary | ICD-10-CM | POA: Diagnosis not present

## 2017-09-10 NOTE — Telephone Encounter (Signed)
Called patient as a friendly reminder for exercise stress test, not to have caffeine or smoke 24 hours prior, and to wear comfortable walking shoes. She stated she has a cough right now and is hoarse. She will be seeing her PCP at 2 today to see if there's anything going on. Advised patient to call back this afternoon to reschedule stress test if needed. She verbalized understanding of all instructions.

## 2017-09-11 ENCOUNTER — Ambulatory Visit (INDEPENDENT_AMBULATORY_CARE_PROVIDER_SITE_OTHER): Payer: PPO

## 2017-09-11 ENCOUNTER — Other Ambulatory Visit: Payer: Self-pay

## 2017-09-11 DIAGNOSIS — R0602 Shortness of breath: Secondary | ICD-10-CM

## 2017-09-12 LAB — EXERCISE TOLERANCE TEST
CHL CUP MPHR: 150 {beats}/min
CSEPED: 3 min
CSEPEW: 5 METS
Exercise duration (sec): 22 s
Peak HR: 134 {beats}/min
Percent HR: 89 %
Rest HR: 99 {beats}/min

## 2017-09-15 ENCOUNTER — Ambulatory Visit
Admission: RE | Admit: 2017-09-15 | Discharge: 2017-09-15 | Disposition: A | Discharge status: Home / Self Care | Payer: PPO | Source: Ambulatory Visit | Attending: Internal Medicine | Admitting: Internal Medicine

## 2017-09-15 DIAGNOSIS — Z1231 Encounter for screening mammogram for malignant neoplasm of breast: Secondary | ICD-10-CM

## 2017-10-09 DIAGNOSIS — J45902 Unspecified asthma with status asthmaticus: Secondary | ICD-10-CM | POA: Diagnosis not present

## 2017-10-09 DIAGNOSIS — J40 Bronchitis, not specified as acute or chronic: Secondary | ICD-10-CM | POA: Diagnosis not present

## 2017-11-11 DIAGNOSIS — K600 Acute anal fissure: Secondary | ICD-10-CM | POA: Diagnosis not present

## 2017-11-11 DIAGNOSIS — K219 Gastro-esophageal reflux disease without esophagitis: Secondary | ICD-10-CM | POA: Diagnosis not present

## 2017-11-11 DIAGNOSIS — K625 Hemorrhage of anus and rectum: Secondary | ICD-10-CM | POA: Diagnosis not present

## 2017-11-11 DIAGNOSIS — E119 Type 2 diabetes mellitus without complications: Secondary | ICD-10-CM | POA: Diagnosis not present

## 2017-11-11 DIAGNOSIS — Z7689 Persons encountering health services in other specified circumstances: Secondary | ICD-10-CM | POA: Diagnosis not present

## 2017-11-11 DIAGNOSIS — H2513 Age-related nuclear cataract, bilateral: Secondary | ICD-10-CM | POA: Diagnosis not present

## 2017-11-11 DIAGNOSIS — R1032 Left lower quadrant pain: Secondary | ICD-10-CM | POA: Diagnosis not present

## 2017-11-19 ENCOUNTER — Other Ambulatory Visit: Payer: Self-pay | Admitting: Gastroenterology

## 2017-11-19 DIAGNOSIS — R1032 Left lower quadrant pain: Secondary | ICD-10-CM

## 2017-11-24 ENCOUNTER — Other Ambulatory Visit: Payer: Self-pay | Admitting: Gastroenterology

## 2017-11-24 DIAGNOSIS — R1032 Left lower quadrant pain: Secondary | ICD-10-CM

## 2017-11-25 ENCOUNTER — Encounter (HOSPITAL_COMMUNITY): Payer: Self-pay

## 2017-11-25 ENCOUNTER — Other Ambulatory Visit: Payer: Self-pay | Admitting: Gastroenterology

## 2017-11-25 ENCOUNTER — Ambulatory Visit (HOSPITAL_COMMUNITY)
Admission: RE | Admit: 2017-11-25 | Discharge: 2017-11-25 | Disposition: A | Discharge status: Home / Self Care | Payer: PPO | Source: Ambulatory Visit | Attending: Gastroenterology | Admitting: Gastroenterology

## 2017-11-25 DIAGNOSIS — R109 Unspecified abdominal pain: Secondary | ICD-10-CM | POA: Diagnosis not present

## 2017-11-25 DIAGNOSIS — R1032 Left lower quadrant pain: Secondary | ICD-10-CM | POA: Diagnosis not present

## 2017-11-25 DIAGNOSIS — I700 Atherosclerosis of aorta: Secondary | ICD-10-CM | POA: Diagnosis not present

## 2017-11-25 DIAGNOSIS — K5730 Diverticulosis of large intestine without perforation or abscess without bleeding: Secondary | ICD-10-CM | POA: Insufficient documentation

## 2017-11-26 ENCOUNTER — Inpatient Hospital Stay: Admission: RE | Admit: 2017-11-26 | Payer: PPO | Source: Ambulatory Visit

## 2017-12-16 ENCOUNTER — Encounter: Payer: Self-pay | Admitting: Cardiovascular Disease

## 2017-12-16 NOTE — Telephone Encounter (Signed)
This encounter was created in error - please disregard.

## 2017-12-18 DIAGNOSIS — H25811 Combined forms of age-related cataract, right eye: Secondary | ICD-10-CM | POA: Diagnosis not present

## 2017-12-18 DIAGNOSIS — H2511 Age-related nuclear cataract, right eye: Secondary | ICD-10-CM | POA: Diagnosis not present

## 2018-01-14 DIAGNOSIS — E119 Type 2 diabetes mellitus without complications: Secondary | ICD-10-CM | POA: Diagnosis not present

## 2018-01-15 DIAGNOSIS — H25812 Combined forms of age-related cataract, left eye: Secondary | ICD-10-CM | POA: Diagnosis not present

## 2018-01-15 DIAGNOSIS — H2512 Age-related nuclear cataract, left eye: Secondary | ICD-10-CM | POA: Diagnosis not present

## 2018-01-21 DIAGNOSIS — E1169 Type 2 diabetes mellitus with other specified complication: Secondary | ICD-10-CM | POA: Insufficient documentation

## 2018-01-21 DIAGNOSIS — E782 Mixed hyperlipidemia: Secondary | ICD-10-CM | POA: Diagnosis not present

## 2018-01-21 DIAGNOSIS — Z79899 Other long term (current) drug therapy: Secondary | ICD-10-CM | POA: Diagnosis not present

## 2018-01-21 DIAGNOSIS — Z0000 Encounter for general adult medical examination without abnormal findings: Secondary | ICD-10-CM | POA: Diagnosis not present

## 2018-02-06 DIAGNOSIS — R229 Localized swelling, mass and lump, unspecified: Secondary | ICD-10-CM | POA: Diagnosis not present

## 2018-04-22 DIAGNOSIS — E782 Mixed hyperlipidemia: Secondary | ICD-10-CM | POA: Diagnosis not present

## 2018-04-22 DIAGNOSIS — E1169 Type 2 diabetes mellitus with other specified complication: Secondary | ICD-10-CM | POA: Diagnosis not present

## 2018-04-22 DIAGNOSIS — F5104 Psychophysiologic insomnia: Secondary | ICD-10-CM | POA: Diagnosis not present

## 2018-07-28 DIAGNOSIS — E782 Mixed hyperlipidemia: Secondary | ICD-10-CM | POA: Diagnosis not present

## 2018-07-28 DIAGNOSIS — E1169 Type 2 diabetes mellitus with other specified complication: Secondary | ICD-10-CM | POA: Diagnosis not present

## 2018-08-04 DIAGNOSIS — Z0000 Encounter for general adult medical examination without abnormal findings: Secondary | ICD-10-CM | POA: Diagnosis not present

## 2018-08-04 DIAGNOSIS — E782 Mixed hyperlipidemia: Secondary | ICD-10-CM | POA: Diagnosis not present

## 2018-08-04 DIAGNOSIS — E1169 Type 2 diabetes mellitus with other specified complication: Secondary | ICD-10-CM | POA: Diagnosis not present

## 2018-08-25 ENCOUNTER — Other Ambulatory Visit: Payer: Self-pay | Admitting: Internal Medicine

## 2018-08-25 DIAGNOSIS — R221 Localized swelling, mass and lump, neck: Secondary | ICD-10-CM

## 2018-08-25 DIAGNOSIS — M542 Cervicalgia: Secondary | ICD-10-CM | POA: Diagnosis not present

## 2018-08-27 ENCOUNTER — Ambulatory Visit
Admission: RE | Admit: 2018-08-27 | Discharge: 2018-08-27 | Disposition: A | Discharge status: Home / Self Care | Payer: PPO | Source: Ambulatory Visit | Attending: Internal Medicine | Admitting: Internal Medicine

## 2018-08-27 DIAGNOSIS — K1120 Sialoadenitis, unspecified: Secondary | ICD-10-CM | POA: Diagnosis not present

## 2018-08-27 DIAGNOSIS — R221 Localized swelling, mass and lump, neck: Secondary | ICD-10-CM | POA: Diagnosis not present

## 2018-09-02 DIAGNOSIS — D37032 Neoplasm of uncertain behavior of the submandibular salivary glands: Secondary | ICD-10-CM | POA: Diagnosis not present

## 2018-09-03 ENCOUNTER — Other Ambulatory Visit: Payer: Self-pay | Admitting: Unknown Physician Specialty

## 2018-09-03 DIAGNOSIS — K118 Other diseases of salivary glands: Secondary | ICD-10-CM

## 2018-09-03 DIAGNOSIS — R221 Localized swelling, mass and lump, neck: Secondary | ICD-10-CM

## 2018-09-04 ENCOUNTER — Other Ambulatory Visit: Payer: Self-pay | Admitting: Radiology

## 2018-09-07 ENCOUNTER — Ambulatory Visit
Admission: RE | Admit: 2018-09-07 | Discharge: 2018-09-07 | Disposition: A | Discharge status: Home / Self Care | Payer: PPO | Source: Ambulatory Visit | Attending: Unknown Physician Specialty | Admitting: Unknown Physician Specialty

## 2018-09-07 DIAGNOSIS — E1165 Type 2 diabetes mellitus with hyperglycemia: Secondary | ICD-10-CM | POA: Insufficient documentation

## 2018-09-07 DIAGNOSIS — K219 Gastro-esophageal reflux disease without esophagitis: Secondary | ICD-10-CM | POA: Insufficient documentation

## 2018-09-07 DIAGNOSIS — Z7984 Long term (current) use of oral hypoglycemic drugs: Secondary | ICD-10-CM | POA: Insufficient documentation

## 2018-09-07 DIAGNOSIS — Z9049 Acquired absence of other specified parts of digestive tract: Secondary | ICD-10-CM | POA: Diagnosis not present

## 2018-09-07 DIAGNOSIS — R221 Localized swelling, mass and lump, neck: Secondary | ICD-10-CM

## 2018-09-07 DIAGNOSIS — Z882 Allergy status to sulfonamides status: Secondary | ICD-10-CM | POA: Diagnosis not present

## 2018-09-07 DIAGNOSIS — Z885 Allergy status to narcotic agent status: Secondary | ICD-10-CM | POA: Diagnosis not present

## 2018-09-07 DIAGNOSIS — F329 Major depressive disorder, single episode, unspecified: Secondary | ICD-10-CM | POA: Insufficient documentation

## 2018-09-07 DIAGNOSIS — Z833 Family history of diabetes mellitus: Secondary | ICD-10-CM | POA: Diagnosis not present

## 2018-09-07 DIAGNOSIS — I10 Essential (primary) hypertension: Secondary | ICD-10-CM | POA: Insufficient documentation

## 2018-09-07 DIAGNOSIS — Z881 Allergy status to other antibiotic agents status: Secondary | ICD-10-CM | POA: Diagnosis not present

## 2018-09-07 DIAGNOSIS — Z79899 Other long term (current) drug therapy: Secondary | ICD-10-CM | POA: Insufficient documentation

## 2018-09-07 DIAGNOSIS — K118 Other diseases of salivary glands: Secondary | ICD-10-CM

## 2018-09-07 DIAGNOSIS — K1120 Sialoadenitis, unspecified: Secondary | ICD-10-CM | POA: Diagnosis not present

## 2018-09-07 DIAGNOSIS — Z791 Long term (current) use of non-steroidal anti-inflammatories (NSAID): Secondary | ICD-10-CM | POA: Diagnosis not present

## 2018-09-07 DIAGNOSIS — K1123 Chronic sialoadenitis: Secondary | ICD-10-CM | POA: Diagnosis not present

## 2018-09-07 HISTORY — DX: Unspecified osteoarthritis, unspecified site: M19.90

## 2018-09-07 HISTORY — DX: Cardiac arrhythmia, unspecified: I49.9

## 2018-09-07 LAB — GLUCOSE, CAPILLARY: GLUCOSE-CAPILLARY: 135 mg/dL — AB (ref 70–99)

## 2018-09-07 MED ORDER — MIDAZOLAM HCL 5 MG/5ML IJ SOLN
INTRAMUSCULAR | Status: AC | PRN
  Administered 2018-09-07: 0.5 mg via INTRAVENOUS
  Administered 2018-09-07 (×2): 1 mg via INTRAVENOUS
  Administered 2018-09-07: 0.5 mg via INTRAVENOUS

## 2018-09-07 MED ORDER — ACETAMINOPHEN 325 MG PO TABS
650.0000 mg | ORAL_TABLET | Freq: Once | ORAL | Status: AC
  Administered 2018-09-07: 650 mg via ORAL

## 2018-09-07 MED ORDER — FENTANYL CITRATE (PF) 100 MCG/2ML IJ SOLN
INTRAMUSCULAR | Status: AC | PRN
  Administered 2018-09-07 (×2): 25 ug via INTRAVENOUS
  Administered 2018-09-07: 50 ug via INTRAVENOUS

## 2018-09-07 MED ORDER — ACETAMINOPHEN 325 MG PO TABS
ORAL_TABLET | ORAL | Status: AC
  Filled 2018-09-07: qty 2

## 2018-09-07 MED ORDER — SODIUM CHLORIDE 0.9 % IV SOLN
INTRAVENOUS | Status: DC
  Administered 2018-09-07: 14:00:00 via INTRAVENOUS

## 2018-09-07 MED ORDER — FENTANYL CITRATE (PF) 100 MCG/2ML IJ SOLN
INTRAMUSCULAR | Status: AC
  Filled 2018-09-07: qty 4

## 2018-09-07 MED ORDER — MIDAZOLAM HCL 5 MG/5ML IJ SOLN
INTRAMUSCULAR | Status: AC
  Filled 2018-09-07: qty 5

## 2018-09-07 NOTE — Procedures (Signed)
Interventional Radiology Procedure Note  Procedure: US Guided Biopsy of Right submandibular gland  Complications: None  Estimated Blood Loss: < 10 mL  Findings: 18 G core biopsy of right submandibular gland performed under US guidance.  Three core samples obtained and sent to Pathology.  Venetia Night. Kathlene Cote, M.D Pager:  [phone]
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
## 2018-09-09 LAB — SURGICAL PATHOLOGY

## 2018-09-10 DIAGNOSIS — D117 Benign neoplasm of other major salivary glands: Secondary | ICD-10-CM | POA: Diagnosis not present

## 2018-11-19 ENCOUNTER — Other Ambulatory Visit: Payer: Self-pay | Admitting: *Deleted

## 2018-11-19 NOTE — Patient Outreach (Signed)
[facility]) Care Management  11/19/2018  Nicole Garcia 04/13/1946 [id number]   Assisting Sonda Rumble, RN (Telephonic Screening) Referral received 11/19/2018  RN spoke pt today and inquired further on pt's request for service. Pt was interested in the [facility] services and assume [facility] offered the same. Pt inquired about having a NP to visit when needed with any emergencies along with a provider if needed. Pt inquired if [facility] was performing home visit as needed as well. RN explained the difference in services as [facility] can make referral to [facility] based upon the pt's having comorbidities that meet their requirements for enrollment. Pt understanding she is not a canidate for [facility] services however her spouse has enrolled based upon this medical issues.   RN further discussed pt's medical issues or potential needs as pt states she would like to control her blood sugar with her diabetes. RN offered to assist and further explained the diabetes program offered via [facility]. RN also discussed CBG/A1C and nutritional habits that would assist with improving her overall diabetes. Pt did not wish to provider her BS readings or A1C at this time. Pt decline ongoing screening information for [facility] services. Pt states she will continue to uilitize her friend who is a Microbiologist and offers assistance when she goes grocery shopping on buying the right foods to help with her diabetes.  Pt again inquired on services that are not offered via [facility] services but available on [facility] as RN explained once again that we do not have a doctors visit or immediately services 24/7 for a home visit if needed. Explained the 24/7 nurse hotline however only following up with a call the following business day for assistance. Verified pt is aware when to seek medical attention for acute events or issues.  Explained that [facility] is available to pt based upon there needs and will troubleshoot with pt and their providers concerning such  barriers but at this time no home visits will take place for such services (pt with understanding). Explained pt can stay in the programs or enroll as often as they need based again on addressing their individual needs for case management services. Pt verbalized an understanding however prefers to have [facility] if she qualifies in the future but grateful for the information. RN offered to send [facility] packet explained services once again (pt receptive). Case will be closed with no further needs to assess at this time.    Raina Mina, RN Care Management Coordinator [place] Office [phone] Pt very grateful and understands all that was discussed today. Pt has opt to decline [facility] services at this time and do not wish to proceed with completing the [facility] screening for further services at this time.

## 2018-12-08 DIAGNOSIS — K1123 Chronic sialoadenitis: Secondary | ICD-10-CM | POA: Diagnosis not present

## 2018-12-08 DIAGNOSIS — M5382 Other specified dorsopathies, cervical region: Secondary | ICD-10-CM | POA: Diagnosis not present

## 2019-01-20 DIAGNOSIS — H00014 Hordeolum externum left upper eyelid: Secondary | ICD-10-CM | POA: Diagnosis not present

## 2019-01-25 ENCOUNTER — Other Ambulatory Visit: Payer: Self-pay | Admitting: Internal Medicine

## 2019-01-25 DIAGNOSIS — Z1231 Encounter for screening mammogram for malignant neoplasm of breast: Secondary | ICD-10-CM

## 2019-01-25 DIAGNOSIS — E782 Mixed hyperlipidemia: Secondary | ICD-10-CM | POA: Diagnosis not present

## 2019-01-25 DIAGNOSIS — E1169 Type 2 diabetes mellitus with other specified complication: Secondary | ICD-10-CM | POA: Diagnosis not present

## 2019-01-29 DIAGNOSIS — H0014 Chalazion left upper eyelid: Secondary | ICD-10-CM | POA: Diagnosis not present

## 2019-01-29 DIAGNOSIS — H0015 Chalazion left lower eyelid: Secondary | ICD-10-CM | POA: Diagnosis not present

## 2019-02-01 DIAGNOSIS — E782 Mixed hyperlipidemia: Secondary | ICD-10-CM | POA: Diagnosis not present

## 2019-02-01 DIAGNOSIS — Z0000 Encounter for general adult medical examination without abnormal findings: Secondary | ICD-10-CM | POA: Diagnosis not present

## 2019-02-01 DIAGNOSIS — K589 Irritable bowel syndrome without diarrhea: Secondary | ICD-10-CM | POA: Diagnosis not present

## 2019-02-01 DIAGNOSIS — I10 Essential (primary) hypertension: Secondary | ICD-10-CM | POA: Diagnosis not present

## 2019-02-01 DIAGNOSIS — E1169 Type 2 diabetes mellitus with other specified complication: Secondary | ICD-10-CM | POA: Diagnosis not present

## 2019-02-08 DIAGNOSIS — H0014 Chalazion left upper eyelid: Secondary | ICD-10-CM | POA: Diagnosis not present

## 2019-03-09 DIAGNOSIS — K1123 Chronic sialoadenitis: Secondary | ICD-10-CM | POA: Diagnosis not present

## 2019-03-09 DIAGNOSIS — H60549 Acute eczematoid otitis externa, unspecified ear: Secondary | ICD-10-CM | POA: Diagnosis not present

## 2019-03-09 DIAGNOSIS — M5382 Other specified dorsopathies, cervical region: Secondary | ICD-10-CM | POA: Diagnosis not present

## 2019-03-09 DIAGNOSIS — H6122 Impacted cerumen, left ear: Secondary | ICD-10-CM | POA: Diagnosis not present

## 2019-03-12 ENCOUNTER — Ambulatory Visit
Admission: RE | Admit: 2019-03-12 | Discharge: 2019-03-12 | Disposition: A | Discharge status: Home / Self Care | Payer: PPO | Source: Ambulatory Visit | Attending: Internal Medicine | Admitting: Internal Medicine

## 2019-03-12 ENCOUNTER — Other Ambulatory Visit: Payer: Self-pay

## 2019-03-12 DIAGNOSIS — Z1231 Encounter for screening mammogram for malignant neoplasm of breast: Secondary | ICD-10-CM | POA: Diagnosis not present

## 2019-03-16 ENCOUNTER — Other Ambulatory Visit: Payer: Self-pay | Admitting: Internal Medicine

## 2019-03-16 DIAGNOSIS — R928 Other abnormal and inconclusive findings on diagnostic imaging of breast: Secondary | ICD-10-CM

## 2019-03-17 ENCOUNTER — Ambulatory Visit
Admission: RE | Admit: 2019-03-17 | Discharge: 2019-03-17 | Disposition: A | Discharge status: Home / Self Care | Payer: PPO | Source: Ambulatory Visit | Attending: Internal Medicine | Admitting: Internal Medicine

## 2019-03-17 ENCOUNTER — Other Ambulatory Visit: Payer: Self-pay

## 2019-03-17 ENCOUNTER — Other Ambulatory Visit: Payer: Self-pay | Admitting: Internal Medicine

## 2019-03-17 DIAGNOSIS — N6320 Unspecified lump in the left breast, unspecified quadrant: Secondary | ICD-10-CM

## 2019-03-17 DIAGNOSIS — N6002 Solitary cyst of left breast: Secondary | ICD-10-CM | POA: Diagnosis not present

## 2019-03-17 DIAGNOSIS — R928 Other abnormal and inconclusive findings on diagnostic imaging of breast: Secondary | ICD-10-CM

## 2019-03-22 DIAGNOSIS — R251 Tremor, unspecified: Secondary | ICD-10-CM | POA: Diagnosis not present

## 2019-03-22 DIAGNOSIS — R928 Other abnormal and inconclusive findings on diagnostic imaging of breast: Secondary | ICD-10-CM | POA: Diagnosis not present

## 2019-03-22 DIAGNOSIS — K580 Irritable bowel syndrome with diarrhea: Secondary | ICD-10-CM | POA: Diagnosis not present

## 2019-03-25 ENCOUNTER — Encounter: Payer: Self-pay | Admitting: Neurology

## 2019-04-26 NOTE — Progress Notes (Signed)
Nicole Garcia was seen today in the movement disorders clinic for neurologic consultation at the request of Rusty Aus, MD.  The consultation is for the evaluation of tremor.  She saw her primary care for this on March 22, 2019.  I have had the opportunity to review those records.  She was placed on propranolol, 20 mg twice daily but states that she is only on it once per day (didn't realize it was bid).   She does think that it helps.  She isn't sure how much helpful as "I don't notice it as much as everyone else."  Estimates that it has been 50% helpful.    Tremor: Yes.     Fam hx of tremor?  Yes.  , sister had tremor in the lips/tongue and hands (sister is now deceased - told it was just "tremors")  Located where?  Lips, head, tongue, L hand.  Started in lips first about a year ago.  Husband noted tremor in bottom lip first.  When has L hand tremor, it is mostly at rest.  L hand tremor not as bad as lips and tongue.  No new medications when it started.  Affected by caffeine:  Doesn't drink caffeine  Affected by alcohol: rarely drinks alcohol  Affected by stress: unknown  Affected by fatigue:  No.  Spills soup if on spoon:  No because she is R handed and tremor is in the L  Affects ADL's (tying shoes, brushing teeth, etc):  No.  Tremor inducing meds:  No.  Other Specific Symptoms:  Voice: no change Sleep: trouble getting asleep  Vivid Dreams:  No.  Acting out dreams:  No. Wet Pillows: Yes.   Postural symptoms:  Yes.   - "off a little bit"  Falls?  None in the last few years - last one several years ago Bradykinesia symptoms: drooling while awake (even talked to the dr about botox) Loss of smell:  No. Loss of taste:  No. Urinary Incontinence:  No. Difficulty Swallowing:  Yes.  , very mild Handwriting, micrographia: No. Depression:  No. Memory changes:  Yes.  , mild Hallucinations:  No.  visual distortions: No. N/V:  No. Lightheaded:  Mild (started before propranolol)  Syncope: No. Diplopia:  No.    Patient had an MRI of the brain in December, 2016.  I had the opportunity to review this.  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  PREVIOUS MEDICATIONS: propranolol  ALLERGIES:   Allergies  Allergen Reactions  . Iohexol Anaphylaxis    Pt blacked out after receiving iv contrast in the past  . 5-Alpha Reductase Inhibitors   . Bactroban [Mupirocin Calcium]   . Ciprofloxacin   . Codeine   . Levofloxacin   . Metformin And Related     diarrhea  . Nitrofurantoin   . Sulfonamide Derivatives     CURRENT MEDICATIONS:  Current Outpatient Medications  Medication Instructions  . ALPRAZolam (XANAX) 1 MG tablet No dose, route, or frequency recorded.  . bisoprolol-hydrochlorothiazide (ZIAC) 2.5-6.25 MG tablet 0.5 tablets, Oral, Daily  . cholecalciferol (VITAMIN D) 1,000 Units, Oral, Daily  . glimepiride (AMARYL) 0.5 mg, Oral, 2 times daily  . hydrOXYzine (ATARAX/VISTARIL) 25 mg, Oral, 3 times daily PRN  . metFORMIN (GLUCOPHAGE-XR) 500 mg, Oral, 2 times daily, TAKE 1/2 BID  . omeprazole (PRILOSEC) 40 mg, Oral, Daily  . Probiotic Product (PROBIOTIC PO) 1 tablet, Oral, Daily  . propranolol (  INDERAL) 20 MG tablet Oral    PAST MEDICAL HISTORY:   Past Medical History:  Diagnosis Date  . Arthritis   . Concussion   . Depression   . Diabetes mellitus without complication (HCC)    diet controlled  . Diverticulitis   . Dysrhythmia    A. Fib hx. of  . GERD (gastroesophageal reflux disease)   . History of chicken pox   . Hyperglycemia   . Hypertension     PAST SURGICAL HISTORY:   Past Surgical History:  Procedure Laterality Date  . ABDOMINAL HYSTERECTOMY    . BLADDER SUSPENSION  1997   microinvasive Protogen  . BREAST EXCISIONAL BIOPSY Left   . BREAST EXCISIONAL BIOPSY Right   . BREAST SURGERY     biopsy 1966 and 2000  . CATARACT EXTRACTION, BILATERAL    .  CHOLECYSTECTOMY    . ROOT CANAL    . TOE SURGERY    . VAGINAL DELIVERY     x2    SOCIAL HISTORY:   Social History   Socioeconomic History  . Marital status: Married    Spouse name: Not on file  . Number of children: 2  . Years of education: Not on file  . Highest education level: High school graduate  Occupational History  . Not on file  Social Needs  . Financial resource strain: Not on file  . Food insecurity    Worry: Patient refused    Inability: Patient refused  . Transportation needs    Medical: Not on file    Non-medical: Not on file  Tobacco Use  . Smoking status: Never Smoker  . Smokeless tobacco: Never Used  Substance and Sexual Activity  . Alcohol use: No    Alcohol/week: 0.0 standard drinks  . Drug use: No  . Sexual activity: Not on file  Lifestyle  . Physical activity    Days per week: Not on file    Minutes per session: Not on file  . Stress: Not on file  Relationships  . Social Herbalist on phone: Not on file    Gets together: Not on file    Attends religious service: Not on file    Active member of club or organization: Not on file    Attends meetings of clubs or organizations: Not on file    Relationship status: Not on file  . Intimate partner violence    Fear of current or ex partner: Not on file    Emotionally abused: Not on file    Physically abused: Not on file    Forced sexual activity: Not on file  Other Topics Concern  . Not on file  Social History Narrative   Lives in [place] with husband. Has dog in home. Son lives in [place] and [place].      Work - [employer], owned 25years.       FAMILY HISTORY:   Family Status  Relation Name Status  . Mother  Deceased at age 75  . Father  Deceased at age 45  . Sister  Deceased  . Brother  Deceased  . Sister  Deceased  . Sister  Alive  . Sister  Alive  . Sister  Deceased  . Sister  Deceased  . Sister  Deceased  . Son X2 Alive    ROS:  Review of Systems   Constitutional: Negative.   HENT: Negative.   Eyes: Negative.   Respiratory: Negative.   Cardiovascular: Negative.  Gastrointestinal: Negative.        Hematochezia - Dr. Collene Mares told her from bleeding hemmorhoids and fissure  Genitourinary: Negative.   Musculoskeletal: Negative.   Skin: Negative.   Neurological:       C/o an intermittent burning over the left shoulder blade  Endo/Heme/Allergies: Negative.     PHYSICAL EXAMINATION:    VITALS:   Vitals:   04/29/19 0858  BP: 119/64  Pulse: 73  SpO2: 98%  Weight: 187 lb (84.8 kg)  Height: 4\' 11"  (1.499 m)    GEN:  The patient appears stated age and is in NAD. HEENT:  Normocephalic, atraumatic.  The mucous membranes are moist. The superficial temporal arteries are without ropiness or tenderness. Mild drooling at corners of mouth CV:  RRR Lungs:  CTAB Neck/HEME:  There are no carotid bruits bilaterally.  Neurological examination:  Orientation: The patient is alert and oriented x3. Fund of knowledge is appropriate.  Recent and remote memory are intact.  Attention and concentration are normal.    Able to name objects and repeat phrases. Cranial nerves: There is good facial symmetry.  Extraocular muscles are intact. The visual fields are full to confrontational testing. The speech is fluent and clear. Soft palate rises symmetrically and there is no tongue deviation. Hearing is intact to conversational tone. Sensation: Sensation is intact to light and pinprick throughout (facial, trunk, extremities). Vibration is intact at the bilateral big toe. There is no extinction with double simultaneous stimulation. There is no sensory dermatomal level identified. Motor: Strength is 5/5 in the bilateral upper and lower extremities.   Shoulder shrug is equal and symmetric.  There is no pronator drift. Deep tendon reflexes: Deep tendon reflexes are 0-1/4 at the bilateral biceps, triceps, brachioradialis, patella and achilles. Plantar responses are  downgoing bilaterally.  Movement examination: Tone: There is normal tone in the bilateral upper extremities.  The tone in the lower extremities is normal.  Abnormal movements: no rest tremor.  No postural tremor.   She has very mild tremor in both hands when asked to pour water from one glass to another, but she does not spill the water.  She has very mild chin tremor that is very intermittent.  I did not see any tongue tremor or fasciculations. Coordination:  There is mild decremation with RAM's, with hand opening and closing on the left and the action of "turning in a light bulb."  All other rapid alternating movements were normal. Gait and Station: The patient has no difficulty arising out of a deep-seated chair without the use of the hands. The patient's stride length is good with good arm swing.     Labs:  Lab work is reviewed from primary care physician.  Lab work is dated January 25, 2019.  White blood cells are 7.7, hemoglobin 12.4, hematocrit 39.8 and platelets 397.  Sodium was 142, potassium 4.4, chloride 105, CO2 28, BUN 12, creatinine 0.9, glucose 147, AST 17, ALT 16, alkaline phosphatase 70.  Hemoglobin A1c 7.0.  Last TSH was July 28, 2018 and was 3.017.  Last vitamin B12 was in 2017 and was 292.  ASSESSMENT/PLAN:  1.  Tremor  -This is likely essential tremor, but she had very little tremor today on examination.  She does think that the propranolol has helped and she is only taking it once per day, as she did not realize her primary care had prescribed it twice per day.  She thinks that she is going to go up on it and see  if she has even more benefits, since it is prescribed as twice per day.  She did not realize it was a blood pressure medication, but has noticed a significant improvement in her blood pressure since it has been prescribed.   -does have a bit of slow movement on the L side (mild) so want to follow her just to make sure she is not developing anything neurodegenerative.   She does not meet any [place] brain bank criteria currently for the diagnosis of Parkinson's disease.  2.  B12 deficiency  -Has not been checked since 2017, but was low then.  -will check b12 today  3.  Notalgia paresthetica  -discussed nature and pathophysiology.  Reassurance provided.  4.  Sialorrhea  -Patient is very bothered by this, but also admits that she is not sure that she wants injections ("I am a wuss.").   we talked about treatments.  The patient is not a candidate for oral anticholinergic therapy because of increased risk of confusion and falls.  We discussed Botox (type A and B) and 1% atropine drops.  We discusssed that candy like lemon drops can help by stimulating mm of the oropharynx to induce swallowing.  She is going to think about her options.  If she decides to try botox, I would like to use myobloc.  5.  F/u 1 year.  Much greater than 50% of this visit was spent in counseling and coordinating care.  Total face to face time:  60 min   Cc:  Rusty Aus, MD

## 2019-04-29 ENCOUNTER — Other Ambulatory Visit: Payer: Self-pay

## 2019-04-29 ENCOUNTER — Other Ambulatory Visit (INDEPENDENT_AMBULATORY_CARE_PROVIDER_SITE_OTHER): Payer: PPO

## 2019-04-29 ENCOUNTER — Ambulatory Visit (INDEPENDENT_AMBULATORY_CARE_PROVIDER_SITE_OTHER): Payer: PPO | Admitting: Neurology

## 2019-04-29 ENCOUNTER — Encounter: Payer: Self-pay | Admitting: Neurology

## 2019-04-29 ENCOUNTER — Telehealth: Payer: Self-pay

## 2019-04-29 VITALS — BP 119/64 | HR 73 | Ht 59.0 in | Wt 187.0 lb

## 2019-04-29 DIAGNOSIS — G250 Essential tremor: Secondary | ICD-10-CM

## 2019-04-29 DIAGNOSIS — E538 Deficiency of other specified B group vitamins: Secondary | ICD-10-CM | POA: Diagnosis not present

## 2019-04-29 DIAGNOSIS — K117 Disturbances of salivary secretion: Secondary | ICD-10-CM

## 2019-04-29 DIAGNOSIS — R202 Paresthesia of skin: Secondary | ICD-10-CM | POA: Diagnosis not present

## 2019-04-29 LAB — VITAMIN B12: Vitamin B-12: 262 pg/mL (ref 211–911)

## 2019-04-29 NOTE — Telephone Encounter (Signed)
Called spoke with patient she was made aware of results 

## 2019-04-29 NOTE — Telephone Encounter (Signed)
-----   Message from [place], DO sent at 04/29/2019  2:14 PM EDT ----- Let pt know that b12 is little low.  Recommend oral supplement, 1078mcg daily

## 2019-04-29 NOTE — Patient Instructions (Signed)
1. Your provider has requested that you have labwork completed today. Please go to [facility] ([id number]) on the second floor of this building before leaving the office today. You do not need to check in. If you are not called within 15 minutes please check with the front desk.   2. Please call our office when you decided if you would like to start Botox (Myobloc) for your drooling so we can get this Prior Auth. By your insurance.

## 2019-05-04 DIAGNOSIS — K219 Gastro-esophageal reflux disease without esophagitis: Secondary | ICD-10-CM | POA: Diagnosis not present

## 2019-05-04 DIAGNOSIS — K5730 Diverticulosis of large intestine without perforation or abscess without bleeding: Secondary | ICD-10-CM | POA: Diagnosis not present

## 2019-05-04 DIAGNOSIS — Z1211 Encounter for screening for malignant neoplasm of colon: Secondary | ICD-10-CM | POA: Diagnosis not present

## 2019-05-04 DIAGNOSIS — E538 Deficiency of other specified B group vitamins: Secondary | ICD-10-CM | POA: Diagnosis not present

## 2019-05-04 DIAGNOSIS — K625 Hemorrhage of anus and rectum: Secondary | ICD-10-CM | POA: Diagnosis not present

## 2019-05-07 ENCOUNTER — Encounter: Payer: Self-pay | Admitting: Cardiovascular Disease

## 2019-05-07 DIAGNOSIS — Z1211 Encounter for screening for malignant neoplasm of colon: Secondary | ICD-10-CM | POA: Diagnosis not present

## 2019-05-07 DIAGNOSIS — D125 Benign neoplasm of sigmoid colon: Secondary | ICD-10-CM | POA: Diagnosis not present

## 2019-05-07 DIAGNOSIS — K635 Polyp of colon: Secondary | ICD-10-CM | POA: Diagnosis not present

## 2019-05-07 DIAGNOSIS — K625 Hemorrhage of anus and rectum: Secondary | ICD-10-CM | POA: Diagnosis not present

## 2019-05-07 DIAGNOSIS — D122 Benign neoplasm of ascending colon: Secondary | ICD-10-CM | POA: Diagnosis not present

## 2019-06-04 DIAGNOSIS — E538 Deficiency of other specified B group vitamins: Secondary | ICD-10-CM | POA: Diagnosis not present

## 2019-06-04 DIAGNOSIS — Z23 Encounter for immunization: Secondary | ICD-10-CM | POA: Diagnosis not present

## 2019-06-08 DIAGNOSIS — D369 Benign neoplasm, unspecified site: Secondary | ICD-10-CM | POA: Insufficient documentation

## 2019-06-08 DIAGNOSIS — N63 Unspecified lump in unspecified breast: Secondary | ICD-10-CM | POA: Diagnosis not present

## 2019-06-08 DIAGNOSIS — E782 Mixed hyperlipidemia: Secondary | ICD-10-CM | POA: Diagnosis not present

## 2019-06-08 DIAGNOSIS — E538 Deficiency of other specified B group vitamins: Secondary | ICD-10-CM | POA: Diagnosis not present

## 2019-06-08 DIAGNOSIS — E1169 Type 2 diabetes mellitus with other specified complication: Secondary | ICD-10-CM | POA: Diagnosis not present

## 2019-06-14 DIAGNOSIS — E119 Type 2 diabetes mellitus without complications: Secondary | ICD-10-CM | POA: Diagnosis not present

## 2019-06-14 DIAGNOSIS — H0015 Chalazion left lower eyelid: Secondary | ICD-10-CM | POA: Diagnosis not present

## 2019-06-14 DIAGNOSIS — Z961 Presence of intraocular lens: Secondary | ICD-10-CM | POA: Diagnosis not present

## 2019-06-14 DIAGNOSIS — H5213 Myopia, bilateral: Secondary | ICD-10-CM | POA: Diagnosis not present

## 2019-06-17 ENCOUNTER — Other Ambulatory Visit: Payer: Self-pay

## 2019-06-17 ENCOUNTER — Ambulatory Visit
Admission: RE | Admit: 2019-06-17 | Discharge: 2019-06-17 | Disposition: A | Discharge status: Home / Self Care | Payer: PPO | Source: Ambulatory Visit | Attending: Internal Medicine | Admitting: Internal Medicine

## 2019-06-17 DIAGNOSIS — N6002 Solitary cyst of left breast: Secondary | ICD-10-CM | POA: Diagnosis not present

## 2019-06-17 DIAGNOSIS — N6320 Unspecified lump in the left breast, unspecified quadrant: Secondary | ICD-10-CM

## 2019-06-29 ENCOUNTER — Other Ambulatory Visit: Payer: Self-pay | Admitting: General Surgery

## 2019-06-29 DIAGNOSIS — N6002 Solitary cyst of left breast: Secondary | ICD-10-CM

## 2019-06-30 DIAGNOSIS — H0016 Chalazion left eye, unspecified eyelid: Secondary | ICD-10-CM | POA: Diagnosis not present

## 2019-07-07 ENCOUNTER — Ambulatory Visit
Admission: RE | Admit: 2019-07-07 | Discharge: 2019-07-07 | Disposition: A | Discharge status: Home / Self Care | Payer: PPO | Source: Ambulatory Visit | Attending: General Surgery | Admitting: General Surgery

## 2019-07-07 ENCOUNTER — Other Ambulatory Visit: Payer: Self-pay

## 2019-07-07 DIAGNOSIS — N6012 Diffuse cystic mastopathy of left breast: Secondary | ICD-10-CM | POA: Diagnosis not present

## 2019-07-07 DIAGNOSIS — N6002 Solitary cyst of left breast: Secondary | ICD-10-CM

## 2019-07-07 DIAGNOSIS — N6322 Unspecified lump in the left breast, upper inner quadrant: Secondary | ICD-10-CM | POA: Diagnosis not present

## 2019-07-27 ENCOUNTER — Other Ambulatory Visit: Payer: Self-pay | Admitting: General Surgery

## 2019-07-27 DIAGNOSIS — T148XXA Other injury of unspecified body region, initial encounter: Secondary | ICD-10-CM

## 2019-07-28 ENCOUNTER — Ambulatory Visit
Admission: RE | Admit: 2019-07-28 | Discharge: 2019-07-28 | Disposition: A | Discharge status: Home / Self Care | Payer: PPO | Source: Ambulatory Visit | Attending: General Surgery | Admitting: General Surgery

## 2019-07-28 ENCOUNTER — Other Ambulatory Visit: Payer: Self-pay

## 2019-07-28 DIAGNOSIS — S2000XA Contusion of breast, unspecified breast, initial encounter: Secondary | ICD-10-CM | POA: Diagnosis not present

## 2019-07-28 DIAGNOSIS — T148XXA Other injury of unspecified body region, initial encounter: Secondary | ICD-10-CM

## 2019-08-04 DIAGNOSIS — E782 Mixed hyperlipidemia: Secondary | ICD-10-CM | POA: Diagnosis not present

## 2019-08-04 DIAGNOSIS — E538 Deficiency of other specified B group vitamins: Secondary | ICD-10-CM | POA: Diagnosis not present

## 2019-08-04 DIAGNOSIS — E1169 Type 2 diabetes mellitus with other specified complication: Secondary | ICD-10-CM | POA: Diagnosis not present

## 2019-08-06 DIAGNOSIS — E538 Deficiency of other specified B group vitamins: Secondary | ICD-10-CM | POA: Diagnosis not present

## 2019-08-06 DIAGNOSIS — E1169 Type 2 diabetes mellitus with other specified complication: Secondary | ICD-10-CM | POA: Diagnosis not present

## 2019-08-06 DIAGNOSIS — Z0000 Encounter for general adult medical examination without abnormal findings: Secondary | ICD-10-CM | POA: Diagnosis not present

## 2019-08-06 DIAGNOSIS — E782 Mixed hyperlipidemia: Secondary | ICD-10-CM | POA: Diagnosis not present

## 2019-09-07 DIAGNOSIS — K1123 Chronic sialoadenitis: Secondary | ICD-10-CM | POA: Diagnosis not present

## 2019-09-07 DIAGNOSIS — H903 Sensorineural hearing loss, bilateral: Secondary | ICD-10-CM | POA: Diagnosis not present

## 2019-09-07 DIAGNOSIS — M5382 Other specified dorsopathies, cervical region: Secondary | ICD-10-CM | POA: Diagnosis not present

## 2019-09-10 ENCOUNTER — Ambulatory Visit: Payer: PPO | Attending: Internal Medicine

## 2019-09-10 DIAGNOSIS — Z23 Encounter for immunization: Secondary | ICD-10-CM | POA: Insufficient documentation

## 2019-09-10 NOTE — Progress Notes (Signed)
   Covid-19 Vaccination Clinic  Name:  Nicole Garcia    MRN: [id number] DOB: 12/08/1945  09/10/2019  [name] was observed post Covid-19 immunization for 15 minutes without incidence. She was provided with Vaccine Information Sheet and instruction to access the V-Safe system.   [name] was instructed to call 911 with any severe reactions post vaccine: Marland Kitchen Difficulty breathing  . Swelling of your face and throat  . A fast heartbeat  . A bad rash all over your body  . Dizziness and weakness    Immunizations Administered    Name Date Dose VIS Date Route   Pfizer COVID-19 Vaccine 09/10/2019  6:46 PM 0.3 mL 07/30/2019 Intramuscular   Manufacturer: [place]   Lot: [id number]   [place]: [id number]

## 2019-09-17 ENCOUNTER — Other Ambulatory Visit: Payer: PPO

## 2019-09-29 DIAGNOSIS — Z8679 Personal history of other diseases of the circulatory system: Secondary | ICD-10-CM | POA: Diagnosis not present

## 2019-09-29 DIAGNOSIS — E782 Mixed hyperlipidemia: Secondary | ICD-10-CM | POA: Diagnosis not present

## 2019-09-29 DIAGNOSIS — E1169 Type 2 diabetes mellitus with other specified complication: Secondary | ICD-10-CM | POA: Diagnosis not present

## 2019-09-29 DIAGNOSIS — H538 Other visual disturbances: Secondary | ICD-10-CM | POA: Diagnosis not present

## 2019-09-29 DIAGNOSIS — F419 Anxiety disorder, unspecified: Secondary | ICD-10-CM | POA: Diagnosis not present

## 2019-09-29 DIAGNOSIS — R000 Tachycardia, unspecified: Secondary | ICD-10-CM | POA: Diagnosis not present

## 2019-09-29 DIAGNOSIS — R519 Headache, unspecified: Secondary | ICD-10-CM | POA: Diagnosis not present

## 2019-10-01 ENCOUNTER — Ambulatory Visit: Payer: PPO | Attending: Internal Medicine

## 2019-10-01 DIAGNOSIS — Z23 Encounter for immunization: Secondary | ICD-10-CM | POA: Insufficient documentation

## 2019-10-01 NOTE — Progress Notes (Signed)
   Covid-19 Vaccination Clinic  Name:  SHANEESE LANFORD    MRN: [id number] DOB: [birth year]  10/01/2019  Ms. Zeno was observed post Covid-19 immunization for 15 minutes without incidence. She was provided with Vaccine Information Sheet and instruction to access the V-Safe system.   Ms. Reske was instructed to call 911 with any severe reactions post vaccine: Marland Kitchen Difficulty breathing  . Swelling of your face and throat  . A fast heartbeat  . A bad rash all over your body  . Dizziness and weakness    Immunizations Administered    Name Date Dose VIS Date Route   Pfizer COVID-19 Vaccine 10/01/2019 10:31 AM 0.3 mL 07/30/2019 Intramuscular   Manufacturer: [place]   Lot: [id number]   [place]: [id number]

## 2019-10-05 DIAGNOSIS — B373 Candidiasis of vulva and vagina: Secondary | ICD-10-CM | POA: Diagnosis not present

## 2019-10-18 DIAGNOSIS — B373 Candidiasis of vulva and vagina: Secondary | ICD-10-CM | POA: Diagnosis not present

## 2019-11-26 DIAGNOSIS — L821 Other seborrheic keratosis: Secondary | ICD-10-CM | POA: Diagnosis not present

## 2019-11-26 DIAGNOSIS — L400 Psoriasis vulgaris: Secondary | ICD-10-CM | POA: Diagnosis not present

## 2019-11-26 DIAGNOSIS — D225 Melanocytic nevi of trunk: Secondary | ICD-10-CM | POA: Diagnosis not present

## 2020-01-24 DIAGNOSIS — R300 Dysuria: Secondary | ICD-10-CM | POA: Diagnosis not present

## 2020-01-31 DIAGNOSIS — E538 Deficiency of other specified B group vitamins: Secondary | ICD-10-CM | POA: Diagnosis not present

## 2020-01-31 DIAGNOSIS — E1169 Type 2 diabetes mellitus with other specified complication: Secondary | ICD-10-CM | POA: Diagnosis not present

## 2020-01-31 DIAGNOSIS — E782 Mixed hyperlipidemia: Secondary | ICD-10-CM | POA: Diagnosis not present

## 2020-02-02 ENCOUNTER — Other Ambulatory Visit: Payer: Self-pay | Admitting: Internal Medicine

## 2020-02-02 DIAGNOSIS — Z1231 Encounter for screening mammogram for malignant neoplasm of breast: Secondary | ICD-10-CM

## 2020-02-07 DIAGNOSIS — E1169 Type 2 diabetes mellitus with other specified complication: Secondary | ICD-10-CM | POA: Diagnosis not present

## 2020-02-07 DIAGNOSIS — E538 Deficiency of other specified B group vitamins: Secondary | ICD-10-CM | POA: Diagnosis not present

## 2020-02-07 DIAGNOSIS — Z0000 Encounter for general adult medical examination without abnormal findings: Secondary | ICD-10-CM | POA: Diagnosis not present

## 2020-02-07 DIAGNOSIS — E782 Mixed hyperlipidemia: Secondary | ICD-10-CM | POA: Diagnosis not present

## 2020-02-17 DIAGNOSIS — R1032 Left lower quadrant pain: Secondary | ICD-10-CM | POA: Diagnosis not present

## 2020-02-17 DIAGNOSIS — M419 Scoliosis, unspecified: Secondary | ICD-10-CM | POA: Diagnosis not present

## 2020-02-17 DIAGNOSIS — I878 Other specified disorders of veins: Secondary | ICD-10-CM | POA: Diagnosis not present

## 2020-02-17 DIAGNOSIS — M160 Bilateral primary osteoarthritis of hip: Secondary | ICD-10-CM | POA: Diagnosis not present

## 2020-03-07 DIAGNOSIS — J019 Acute sinusitis, unspecified: Secondary | ICD-10-CM | POA: Diagnosis not present

## 2020-03-07 DIAGNOSIS — Z20822 Contact with and (suspected) exposure to covid-19: Secondary | ICD-10-CM | POA: Diagnosis not present

## 2020-03-07 DIAGNOSIS — K1123 Chronic sialoadenitis: Secondary | ICD-10-CM | POA: Diagnosis not present

## 2020-03-14 ENCOUNTER — Ambulatory Visit: Payer: PPO

## 2020-03-29 ENCOUNTER — Ambulatory Visit
Admission: RE | Admit: 2020-03-29 | Discharge: 2020-03-29 | Disposition: A | Discharge status: Home / Self Care | Payer: PPO | Source: Ambulatory Visit | Attending: Internal Medicine | Admitting: Internal Medicine

## 2020-03-29 ENCOUNTER — Other Ambulatory Visit: Payer: Self-pay

## 2020-03-29 DIAGNOSIS — Z1231 Encounter for screening mammogram for malignant neoplasm of breast: Secondary | ICD-10-CM | POA: Diagnosis not present

## 2020-04-17 DIAGNOSIS — R1084 Generalized abdominal pain: Secondary | ICD-10-CM | POA: Diagnosis not present

## 2020-04-18 DIAGNOSIS — H524 Presbyopia: Secondary | ICD-10-CM | POA: Diagnosis not present

## 2020-04-18 DIAGNOSIS — H43813 Vitreous degeneration, bilateral: Secondary | ICD-10-CM | POA: Diagnosis not present

## 2020-04-18 DIAGNOSIS — E119 Type 2 diabetes mellitus without complications: Secondary | ICD-10-CM | POA: Diagnosis not present

## 2020-04-18 DIAGNOSIS — H26493 Other secondary cataract, bilateral: Secondary | ICD-10-CM | POA: Diagnosis not present

## 2020-04-28 NOTE — Progress Notes (Signed)
Assessment/Plan:    1.  Tremor  -No evidence today of any neurodegenerative disorder like Parkinson's disease.  -no evidence of tremor today and not on any meds today   2.  B12 deficiency  -Upon recheck, her vitamin B-12 was now supratherapeutic but pt states not on supplement.    3.  F/u prn Subjective:   Nicole Garcia was seen today in follow up for tremor.  My previous records were reviewed prior to todays visit.  When I saw the patient last visit, she had very little tremor, but was already being treated with propranolol.  I thought she probably had essential tremor based on history, but we decided to follow her just because she had some mild slowness on the left side, just to make sure she was not developing a neurodegenerative process.  Since our last visit, she denies falls.  Her B12 was just a little bit low when rechecked last visit.  Told her to take a B12 over-the-counter supplement.  She is having foot pain - no paresthesias.  Worse if wearing flat shoes.    Current prescribed movement disorder medications: propranolol 20 mg qd-bid (pt states that she is now off of it - pt states that "it tore my stomach up."   ALLERGIES:   Allergies  Allergen Reactions  . Iohexol Anaphylaxis    Pt blacked out after receiving iv contrast in the past  . 5-Alpha Reductase Inhibitors   . Bactroban [Mupirocin Calcium]   . Ciprofloxacin   . Codeine   . Levofloxacin   . Metformin And Related     diarrhea  . Nitrofurantoin   . Sulfonamide Derivatives     CURRENT MEDICATIONS:  Outpatient Encounter Medications as of 05/02/2020  Medication Sig  . ALPRAZolam (XANAX) 1 MG tablet Take 1 mg by mouth at bedtime as needed.   . bisoprolol-hydrochlorothiazide (ZIAC) 2.5-6.25 MG tablet Take 0.5 tablets by mouth daily.  . cholecalciferol (VITAMIN D) 1000 UNITS tablet Take 1,000 Units by mouth daily.  . furosemide (LASIX) 20 MG tablet Take 20 mg by mouth as needed.  Marland Kitchen glimepiride (AMARYL) 1  MG tablet Take 0.5 mg by mouth. Take 1 tab in the am and half tab in the pm  . metFORMIN (GLUCOPHAGE-XR) 500 MG 24 hr tablet Take 500 mg by mouth daily with breakfast.   . omeprazole (PRILOSEC) 20 MG capsule Take 20 mg by mouth daily.  . Probiotic Product (PROBIOTIC PO) Take 1 tablet by mouth daily.  . [DISCONTINUED] hydrOXYzine (ATARAX/VISTARIL) 25 MG tablet Take 25 mg by mouth 3 (three) times daily as needed. (Patient not taking: Reported on 05/02/2020)  . [DISCONTINUED] omeprazole (PRILOSEC) 40 MG capsule Take 1 capsule (40 mg total) by mouth daily. (Patient taking differently: Take 20 mg by mouth daily. )  . [DISCONTINUED] propranolol (INDERAL) 20 MG tablet Take by mouth. (Patient not taking: Reported on 05/02/2020)   No facility-administered encounter medications on file as of 05/02/2020.     Objective:    PHYSICAL EXAMINATION:    VITALS:   Vitals:   05/02/20 1048  BP: 123/73  Pulse: 67  SpO2: 98%  Weight: 158 lb (71.7 kg)  Height: 4\' 10"  (1.473 m)    GEN:  The patient appears stated age and is in NAD. HEENT:  Normocephalic, atraumatic.  The mucous membranes are moist. The superficial temporal arteries are without ropiness or tenderness. CV:  RRR Lungs:  CTAB Neck/HEME:  There are no carotid bruits bilaterally.  Neurological examination:  Orientation: The patient is alert and oriented x3. Cranial nerves: There is good facial symmetry. The speech is fluent and clear. Soft palate rises symmetrically and there is no tongue deviation. Hearing is intact to conversational tone. Sensation: Sensation is intact to light touch throughout Motor: Strength is at least antigravity x4.  Movement examination: Tone: There is normal tone in the UE/LE Abnormal movements: none Coordination:  There is no decremation with RAM's, with any form of RAMS, including alternating supination and pronation of the forearm, hand opening and closing, finger taps, heel taps and toe taps. Gait and Station:  The patient has no difficulty arising out of a deep-seated chair without the use of the hands. The patient's stride length is good I have reviewed and interpreted the following labs independently She had lab work by primary care on January 31, 2020.  Sodium was 143, potassium 5.0, chloride 104, CO2 31, BUN 22, creatinine 0.9, glucose 144, white blood cells 6.7, hemoglobin 12.6, hematocrit 41.3 and platelets 423.  Her hemoglobin A1c was 6.5.  They rechecked her vitamin B12 and it was 1128. Lab Results  Component Value Date   VITAMINB12 262 04/29/2019       Total time spent on today's visit was 20 minutes, including both face-to-face time and nonface-to-face time.  Time included that spent on review of records (prior notes available to me/labs/imaging if pertinent), discussing treatment and goals, answering patient's questions and coordinating care.  Cc:  Rusty Aus, MD

## 2020-05-02 ENCOUNTER — Encounter: Payer: Self-pay | Admitting: Neurology

## 2020-05-02 ENCOUNTER — Other Ambulatory Visit: Payer: Self-pay

## 2020-05-02 ENCOUNTER — Ambulatory Visit: Payer: PPO | Admitting: Neurology

## 2020-05-02 VITALS — BP 123/73 | HR 67 | Ht <= 58 in | Wt 158.0 lb

## 2020-05-02 DIAGNOSIS — R251 Tremor, unspecified: Secondary | ICD-10-CM | POA: Diagnosis not present

## 2020-05-02 NOTE — Patient Instructions (Signed)
You look great!    The physicians and staff at  [facility] are committed to providing excellent care. You may receive a survey requesting feedback about your experience at our office. We strive to receive "very good" responses to the survey questions. If you feel that your experience would prevent you from giving the office a "very good " response, please contact our office to try to remedy the situation. We may be reached at [phone]. Thank you for taking the time out of your busy day to complete the survey.  

## 2020-07-21 DIAGNOSIS — L668 Other cicatricial alopecia: Secondary | ICD-10-CM | POA: Diagnosis not present

## 2020-07-21 DIAGNOSIS — L650 Telogen effluvium: Secondary | ICD-10-CM | POA: Diagnosis not present

## 2020-07-21 DIAGNOSIS — L400 Psoriasis vulgaris: Secondary | ICD-10-CM | POA: Diagnosis not present

## 2020-08-01 DIAGNOSIS — E1169 Type 2 diabetes mellitus with other specified complication: Secondary | ICD-10-CM | POA: Diagnosis not present

## 2020-08-01 DIAGNOSIS — E538 Deficiency of other specified B group vitamins: Secondary | ICD-10-CM | POA: Diagnosis not present

## 2020-08-01 DIAGNOSIS — E782 Mixed hyperlipidemia: Secondary | ICD-10-CM | POA: Diagnosis not present

## 2020-08-08 DIAGNOSIS — E538 Deficiency of other specified B group vitamins: Secondary | ICD-10-CM | POA: Diagnosis not present

## 2020-08-08 DIAGNOSIS — K719 Toxic liver disease, unspecified: Secondary | ICD-10-CM | POA: Diagnosis not present

## 2020-08-08 DIAGNOSIS — T466X5A Adverse effect of antihyperlipidemic and antiarteriosclerotic drugs, initial encounter: Secondary | ICD-10-CM | POA: Diagnosis not present

## 2020-08-08 DIAGNOSIS — E1169 Type 2 diabetes mellitus with other specified complication: Secondary | ICD-10-CM | POA: Diagnosis not present

## 2020-08-08 DIAGNOSIS — E782 Mixed hyperlipidemia: Secondary | ICD-10-CM | POA: Diagnosis not present

## 2020-08-08 DIAGNOSIS — Z0000 Encounter for general adult medical examination without abnormal findings: Secondary | ICD-10-CM | POA: Diagnosis not present

## 2020-08-09 DIAGNOSIS — T466X5A Adverse effect of antihyperlipidemic and antiarteriosclerotic drugs, initial encounter: Secondary | ICD-10-CM | POA: Insufficient documentation

## 2020-09-07 DIAGNOSIS — Z20822 Contact with and (suspected) exposure to covid-19: Secondary | ICD-10-CM | POA: Diagnosis not present

## 2020-09-07 DIAGNOSIS — R110 Nausea: Secondary | ICD-10-CM | POA: Diagnosis not present

## 2020-09-11 DIAGNOSIS — R110 Nausea: Secondary | ICD-10-CM | POA: Diagnosis not present

## 2020-09-11 DIAGNOSIS — R519 Headache, unspecified: Secondary | ICD-10-CM | POA: Diagnosis not present

## 2020-09-11 DIAGNOSIS — Z20822 Contact with and (suspected) exposure to covid-19: Secondary | ICD-10-CM | POA: Diagnosis not present

## 2020-10-12 DIAGNOSIS — Z6833 Body mass index (BMI) 33.0-33.9, adult: Secondary | ICD-10-CM | POA: Diagnosis not present

## 2020-10-12 DIAGNOSIS — E669 Obesity, unspecified: Secondary | ICD-10-CM | POA: Diagnosis not present

## 2020-12-13 ENCOUNTER — Other Ambulatory Visit: Payer: Self-pay | Admitting: Internal Medicine

## 2020-12-13 ENCOUNTER — Other Ambulatory Visit: Payer: Self-pay | Admitting: Nurse Practitioner

## 2020-12-13 DIAGNOSIS — Z1231 Encounter for screening mammogram for malignant neoplasm of breast: Secondary | ICD-10-CM

## 2020-12-14 DIAGNOSIS — M79622 Pain in left upper arm: Secondary | ICD-10-CM | POA: Diagnosis not present

## 2020-12-14 DIAGNOSIS — M7061 Trochanteric bursitis, right hip: Secondary | ICD-10-CM | POA: Diagnosis not present

## 2021-01-12 DIAGNOSIS — D2371 Other benign neoplasm of skin of right lower limb, including hip: Secondary | ICD-10-CM | POA: Diagnosis not present

## 2021-01-12 DIAGNOSIS — L400 Psoriasis vulgaris: Secondary | ICD-10-CM | POA: Diagnosis not present

## 2021-01-12 DIAGNOSIS — L821 Other seborrheic keratosis: Secondary | ICD-10-CM | POA: Diagnosis not present

## 2021-01-30 DIAGNOSIS — E782 Mixed hyperlipidemia: Secondary | ICD-10-CM | POA: Diagnosis not present

## 2021-01-30 DIAGNOSIS — E1169 Type 2 diabetes mellitus with other specified complication: Secondary | ICD-10-CM | POA: Diagnosis not present

## 2021-01-30 DIAGNOSIS — R300 Dysuria: Secondary | ICD-10-CM | POA: Diagnosis not present

## 2021-02-06 DIAGNOSIS — Z0000 Encounter for general adult medical examination without abnormal findings: Secondary | ICD-10-CM | POA: Diagnosis not present

## 2021-02-06 DIAGNOSIS — E782 Mixed hyperlipidemia: Secondary | ICD-10-CM | POA: Diagnosis not present

## 2021-02-06 DIAGNOSIS — E538 Deficiency of other specified B group vitamins: Secondary | ICD-10-CM | POA: Diagnosis not present

## 2021-02-06 DIAGNOSIS — T466X5A Adverse effect of antihyperlipidemic and antiarteriosclerotic drugs, initial encounter: Secondary | ICD-10-CM | POA: Diagnosis not present

## 2021-02-06 DIAGNOSIS — E1169 Type 2 diabetes mellitus with other specified complication: Secondary | ICD-10-CM | POA: Diagnosis not present

## 2021-02-06 DIAGNOSIS — K719 Toxic liver disease, unspecified: Secondary | ICD-10-CM | POA: Diagnosis not present

## 2021-02-20 ENCOUNTER — Other Ambulatory Visit: Payer: Self-pay | Admitting: Unknown Physician Specialty

## 2021-02-20 DIAGNOSIS — R42 Dizziness and giddiness: Secondary | ICD-10-CM | POA: Diagnosis not present

## 2021-03-05 DIAGNOSIS — R42 Dizziness and giddiness: Secondary | ICD-10-CM | POA: Diagnosis not present

## 2021-03-07 ENCOUNTER — Ambulatory Visit
Admission: RE | Admit: 2021-03-07 | Discharge: 2021-03-07 | Disposition: A | Discharge status: Home / Self Care | Payer: PPO | Source: Ambulatory Visit | Attending: Unknown Physician Specialty | Admitting: Unknown Physician Specialty

## 2021-03-07 ENCOUNTER — Other Ambulatory Visit: Payer: Self-pay

## 2021-03-07 DIAGNOSIS — R42 Dizziness and giddiness: Secondary | ICD-10-CM | POA: Diagnosis not present

## 2021-03-07 DIAGNOSIS — I6523 Occlusion and stenosis of bilateral carotid arteries: Secondary | ICD-10-CM | POA: Diagnosis not present

## 2021-03-07 DIAGNOSIS — I10 Essential (primary) hypertension: Secondary | ICD-10-CM | POA: Diagnosis not present

## 2021-03-07 DIAGNOSIS — E119 Type 2 diabetes mellitus without complications: Secondary | ICD-10-CM | POA: Diagnosis not present

## 2021-03-09 DIAGNOSIS — M7061 Trochanteric bursitis, right hip: Secondary | ICD-10-CM | POA: Diagnosis not present

## 2021-03-09 DIAGNOSIS — M25551 Pain in right hip: Secondary | ICD-10-CM | POA: Diagnosis not present

## 2021-03-09 DIAGNOSIS — F5104 Psychophysiologic insomnia: Secondary | ICD-10-CM | POA: Diagnosis not present

## 2021-03-21 DIAGNOSIS — E1169 Type 2 diabetes mellitus with other specified complication: Secondary | ICD-10-CM | POA: Diagnosis not present

## 2021-03-21 DIAGNOSIS — E782 Mixed hyperlipidemia: Secondary | ICD-10-CM | POA: Diagnosis not present

## 2021-03-21 DIAGNOSIS — M5116 Intervertebral disc disorders with radiculopathy, lumbar region: Secondary | ICD-10-CM | POA: Diagnosis not present

## 2021-03-30 ENCOUNTER — Other Ambulatory Visit: Payer: Self-pay

## 2021-03-30 ENCOUNTER — Ambulatory Visit
Admission: RE | Admit: 2021-03-30 | Discharge: 2021-03-30 | Disposition: A | Discharge status: Home / Self Care | Payer: PPO | Source: Ambulatory Visit | Attending: Internal Medicine | Admitting: Internal Medicine

## 2021-03-30 DIAGNOSIS — Z1231 Encounter for screening mammogram for malignant neoplasm of breast: Secondary | ICD-10-CM

## 2021-04-05 DIAGNOSIS — G8929 Other chronic pain: Secondary | ICD-10-CM | POA: Diagnosis not present

## 2021-04-05 DIAGNOSIS — M5441 Lumbago with sciatica, right side: Secondary | ICD-10-CM | POA: Diagnosis not present

## 2021-04-11 DIAGNOSIS — G8929 Other chronic pain: Secondary | ICD-10-CM | POA: Diagnosis not present

## 2021-04-11 DIAGNOSIS — M5441 Lumbago with sciatica, right side: Secondary | ICD-10-CM | POA: Diagnosis not present

## 2021-04-18 DIAGNOSIS — R300 Dysuria: Secondary | ICD-10-CM | POA: Diagnosis not present

## 2021-04-18 DIAGNOSIS — R252 Cramp and spasm: Secondary | ICD-10-CM | POA: Diagnosis not present

## 2021-04-18 DIAGNOSIS — E119 Type 2 diabetes mellitus without complications: Secondary | ICD-10-CM | POA: Diagnosis not present

## 2021-04-18 DIAGNOSIS — Z961 Presence of intraocular lens: Secondary | ICD-10-CM | POA: Diagnosis not present

## 2021-04-18 DIAGNOSIS — H52203 Unspecified astigmatism, bilateral: Secondary | ICD-10-CM | POA: Diagnosis not present

## 2021-04-18 DIAGNOSIS — F4321 Adjustment disorder with depressed mood: Secondary | ICD-10-CM | POA: Diagnosis not present

## 2021-04-20 DIAGNOSIS — G8929 Other chronic pain: Secondary | ICD-10-CM | POA: Diagnosis not present

## 2021-04-20 DIAGNOSIS — M5441 Lumbago with sciatica, right side: Secondary | ICD-10-CM | POA: Diagnosis not present

## 2021-04-25 DIAGNOSIS — G8929 Other chronic pain: Secondary | ICD-10-CM | POA: Diagnosis not present

## 2021-04-25 DIAGNOSIS — M5441 Lumbago with sciatica, right side: Secondary | ICD-10-CM | POA: Diagnosis not present

## 2021-05-08 DIAGNOSIS — M5441 Lumbago with sciatica, right side: Secondary | ICD-10-CM | POA: Diagnosis not present

## 2021-05-08 DIAGNOSIS — G8929 Other chronic pain: Secondary | ICD-10-CM | POA: Diagnosis not present

## 2021-05-09 DIAGNOSIS — E1169 Type 2 diabetes mellitus with other specified complication: Secondary | ICD-10-CM | POA: Diagnosis not present

## 2021-05-09 DIAGNOSIS — F4321 Adjustment disorder with depressed mood: Secondary | ICD-10-CM | POA: Diagnosis not present

## 2021-05-09 DIAGNOSIS — E782 Mixed hyperlipidemia: Secondary | ICD-10-CM | POA: Diagnosis not present

## 2021-05-09 DIAGNOSIS — E538 Deficiency of other specified B group vitamins: Secondary | ICD-10-CM | POA: Diagnosis not present

## 2021-05-09 DIAGNOSIS — R200 Anesthesia of skin: Secondary | ICD-10-CM | POA: Diagnosis not present

## 2021-05-09 DIAGNOSIS — R42 Dizziness and giddiness: Secondary | ICD-10-CM | POA: Diagnosis not present

## 2021-05-09 DIAGNOSIS — E519 Thiamine deficiency, unspecified: Secondary | ICD-10-CM | POA: Diagnosis not present

## 2021-05-09 DIAGNOSIS — E531 Pyridoxine deficiency: Secondary | ICD-10-CM | POA: Diagnosis not present

## 2021-05-15 ENCOUNTER — Other Ambulatory Visit: Payer: Self-pay | Admitting: Neurology

## 2021-05-15 ENCOUNTER — Other Ambulatory Visit (HOSPITAL_COMMUNITY): Payer: Self-pay | Admitting: Neurology

## 2021-05-15 DIAGNOSIS — M5441 Lumbago with sciatica, right side: Secondary | ICD-10-CM | POA: Diagnosis not present

## 2021-05-15 DIAGNOSIS — G8929 Other chronic pain: Secondary | ICD-10-CM | POA: Diagnosis not present

## 2021-05-15 DIAGNOSIS — R42 Dizziness and giddiness: Secondary | ICD-10-CM

## 2021-05-23 ENCOUNTER — Other Ambulatory Visit: Payer: Self-pay

## 2021-05-23 ENCOUNTER — Ambulatory Visit (INDEPENDENT_AMBULATORY_CARE_PROVIDER_SITE_OTHER): Payer: PPO

## 2021-05-23 ENCOUNTER — Encounter: Payer: Self-pay | Admitting: Podiatry

## 2021-05-23 ENCOUNTER — Ambulatory Visit: Payer: PPO | Admitting: Podiatry

## 2021-05-23 DIAGNOSIS — M778 Other enthesopathies, not elsewhere classified: Secondary | ICD-10-CM | POA: Diagnosis not present

## 2021-05-23 MED ORDER — CYCLOBENZAPRINE HCL 10 MG PO TABS: 10.0000 mg | ORAL_TABLET | Freq: Three times a day (TID) | ORAL | 0 refills | Status: DC | PRN

## 2021-05-23 NOTE — Progress Notes (Addendum)
Subjective:  Patient ID: Nicole Garcia, female    DOB: 1946-07-10,  MRN: [id number] HPI Chief Complaint  Patient presents with   Ankle Pain    Ankles bilateral (L>R) - cramping initially in plantar forefoot, now has moved into lateral ankles x few months, some swelling, "it catches", eats mustard, Dr. Sabra Heck Rx'd pramipexole but made her sick   New Patient (Initial Visit)   Diabetes    Last a1c was 6.7    75 y.o. female presents with the above complaint.   ROS: Denies fever chills nausea vomiting muscle aches pains calf pain back pain chest pain shortness of breath.  Past Medical History:  Diagnosis Date   Arthritis    Concussion    Depression    Diabetes mellitus without complication ([place])    diet controlled   Diverticulitis    Dysrhythmia    A. Fib hx. of   GERD (gastroesophageal reflux disease)    History of chicken pox    Hyperglycemia    Hypertension    Past Surgical History:  Procedure Laterality Date   ABDOMINAL HYSTERECTOMY     BLADDER SUSPENSION  1997   microinvasive Protogen   BREAST EXCISIONAL BIOPSY Left    BREAST EXCISIONAL BIOPSY Right    BREAST SURGERY     biopsy 1966 and 2000   CATARACT EXTRACTION, BILATERAL     CHOLECYSTECTOMY     ROOT CANAL     TOE SURGERY     VAGINAL DELIVERY     x2    Current Outpatient Medications:    cyclobenzaprine (FLEXERIL) 10 MG tablet, Take 1 tablet (10 mg total) by mouth 3 (three) times daily as needed for muscle spasms., Disp: 30 tablet, Rfl: 0   ALPRAZolam (XANAX) 1 MG tablet, Take 1 mg by mouth at bedtime as needed. , Disp: , Rfl:    bisoprolol-hydrochlorothiazide (ZIAC) 2.5-6.25 MG tablet, Take 0.5 tablets by mouth daily., Disp: , Rfl:    busPIRone (BUSPAR) 5 MG tablet, Take 5 mg by mouth 2 (two) times daily., Disp: , Rfl:    cholecalciferol (VITAMIN D) 1000 UNITS tablet, Take 1,000 Units by mouth daily., Disp: , Rfl:    clobetasol cream (TEMOVATE) 0.05 %, Apply topically 2 (two) times daily., Disp: , Rfl:     furosemide (LASIX) 20 MG tablet, Take 20 mg by mouth as needed., Disp: , Rfl:    gabapentin (NEURONTIN) 100 MG capsule, Take 200 mg by mouth at bedtime., Disp: , Rfl:    glimepiride (AMARYL) 1 MG tablet, Take 0.5 mg by mouth. Take 1 tab in the am and half tab in the pm, Disp: , Rfl:    meloxicam (MOBIC) 7.5 MG tablet, Take 7.5 mg by mouth daily., Disp: , Rfl:    metFORMIN (GLUCOPHAGE-XR) 500 MG 24 hr tablet, Take 500 mg by mouth daily with breakfast. , Disp: , Rfl:    methocarbamol (ROBAXIN) 500 MG tablet, Take 500 mg by mouth 2 (two) times daily as needed., Disp: , Rfl:    omeprazole (PRILOSEC) 20 MG capsule, Take 20 mg by mouth daily., Disp: , Rfl:    pramipexole (MIRAPEX) 0.5 MG tablet, Take 0.5 mg by mouth at bedtime., Disp: , Rfl:    Probiotic Product (PROBIOTIC PO), Take 1 tablet by mouth daily., Disp: , Rfl:   Allergies  Allergen Reactions   Iohexol Anaphylaxis    Pt blacked out after receiving iv contrast in the past   5-Alpha Reductase Inhibitors    Bactroban [Mupirocin Calcium]  Ciprofloxacin    Codeine    Levofloxacin    Linaclotide     Other reaction(s): Other (See Comments)   Metformin And Related     diarrhea   Nitrofurantoin    Sulfonamide Derivatives    Review of Systems Objective:  There were no vitals filed for this visit.  General: Well developed, nourished, in no acute distress, alert and oriented x3   Dermatological: Skin is warm, dry and supple bilateral. Nails x 10 are well maintained; remaining integument appears unremarkable at this time. There are no open sores, no preulcerative lesions, no rash or signs of infection present.  Vascular: Dorsalis Pedis artery and Posterior Tibial artery pedal pulses are 2/4 bilateral with immedate capillary fill time. Pedal hair growth present. No varicosities and no lower extremity edema present bilateral.   Neruologic: Grossly intact via light touch bilateral. Vibratory intact via tuning fork bilateral. Protective  threshold with Semmes Wienstein monofilament intact to all pedal sites bilateral. Patellar and Achilles deep tendon reflexes 2+ bilateral. No Babinski or clonus noted bilateral.   Musculoskeletal: No gross boney pedal deformities bilateral. No pain, crepitus, or limitation noted with foot and ankle range of motion bilateral. Muscular strength 5/5 in all groups tested bilateral.  Gait: Unassisted, Nonantalgic.    Radiographs: Radiographs taken today demonstrate an osseously mature individual no significant acute findings.  Plantar and posterior calcaneal heel spurs present.  Assessment & Plan:   Assessment: With concentrated urine consider dehydration and cramping possibly discontinuing the fluid pill may help.  Also we will start her on cyclobenzaprine.  Provided her with information regarding power drinks that may help.  Plan: Cyclobenzaprine recommended she talk to her primary care about this and consider discontinuing hydrochlorothiazide discussed power drinks I will follow-up with her in a few weeks     Nicole Garcia, [place]

## 2021-05-25 ENCOUNTER — Ambulatory Visit: Payer: PPO

## 2021-05-31 ENCOUNTER — Other Ambulatory Visit: Payer: Self-pay

## 2021-05-31 ENCOUNTER — Ambulatory Visit
Admission: RE | Admit: 2021-05-31 | Discharge: 2021-05-31 | Disposition: A | Discharge status: Home / Self Care | Payer: PPO | Source: Ambulatory Visit | Attending: Neurology | Admitting: Neurology

## 2021-05-31 ENCOUNTER — Other Ambulatory Visit: Payer: Self-pay | Admitting: Neurology

## 2021-05-31 DIAGNOSIS — R42 Dizziness and giddiness: Secondary | ICD-10-CM

## 2021-05-31 DIAGNOSIS — R519 Headache, unspecified: Secondary | ICD-10-CM | POA: Diagnosis not present

## 2021-06-20 ENCOUNTER — Ambulatory Visit: Payer: PPO | Admitting: Podiatry

## 2021-06-21 IMAGING — MG MM BREAST LOCALIZATION CLIP
4 series · 4 of 12 positions shown · non-contrast
Comparison: Previous exam(s).

CLINICAL DATA: Patient with indeterminate left breast mass 10
o'clock position.

EXAM:
DIAGNOSTIC LEFT MAMMOGRAM POST ULTRASOUND BIOPSY

[L ML synth-2D]
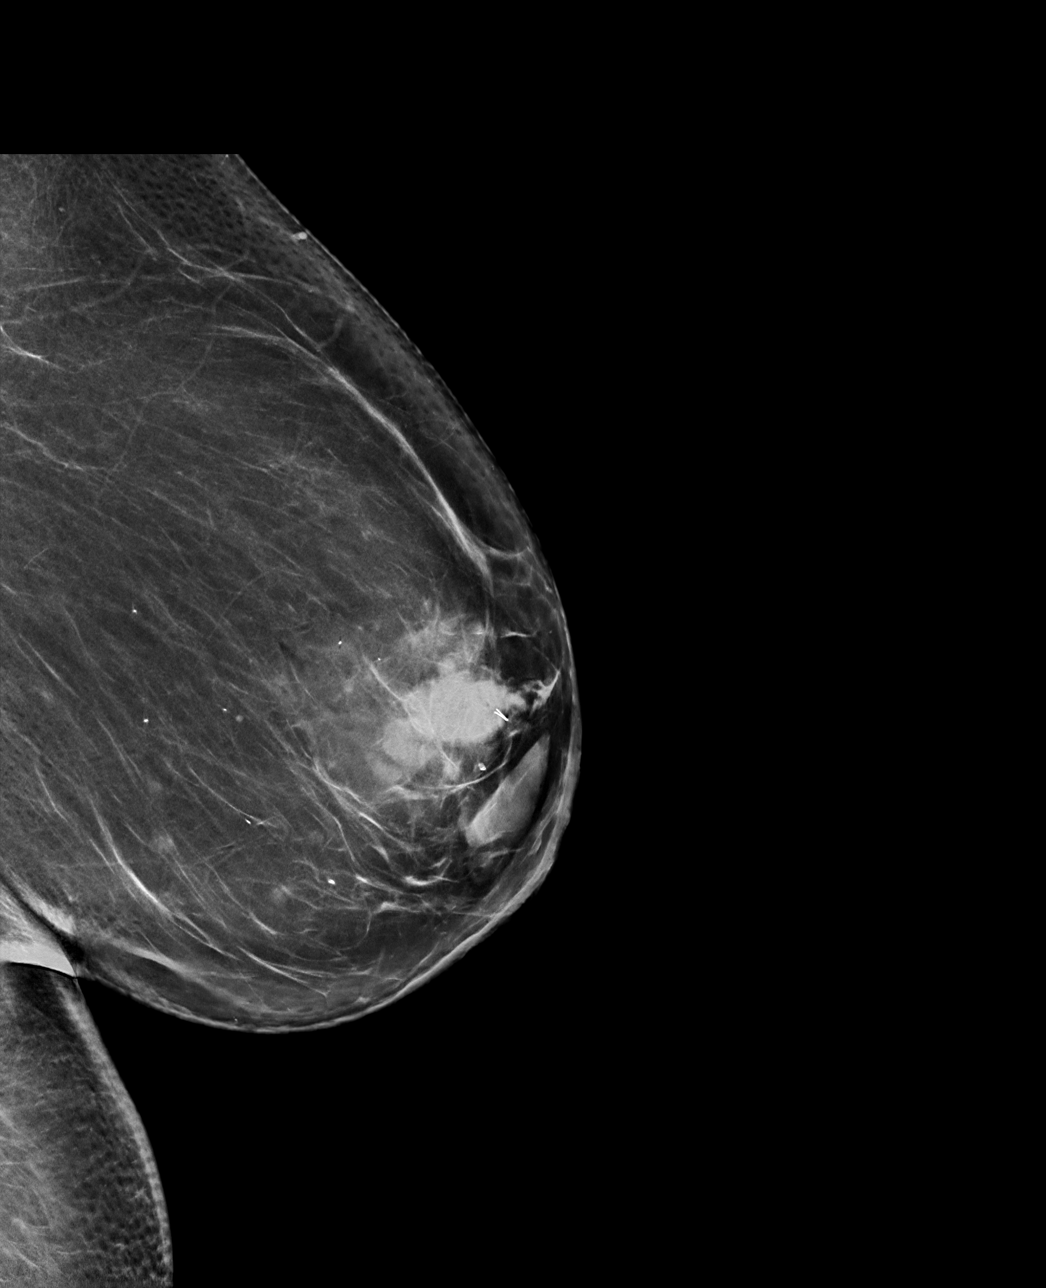

[L CC synth-2D]
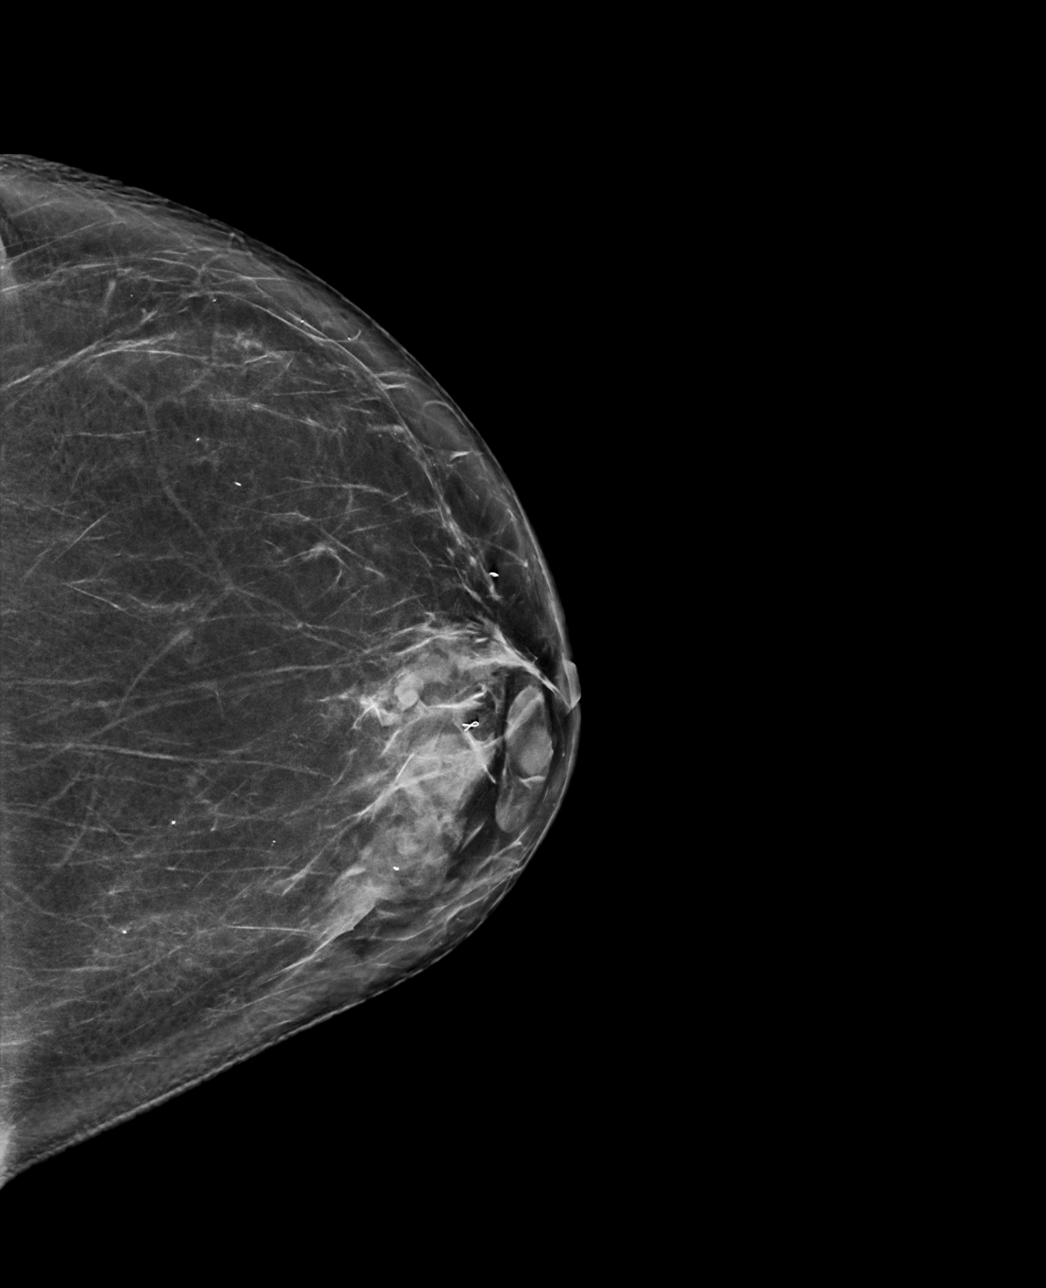

[L CC tomo · tomo slice 41/80.0]
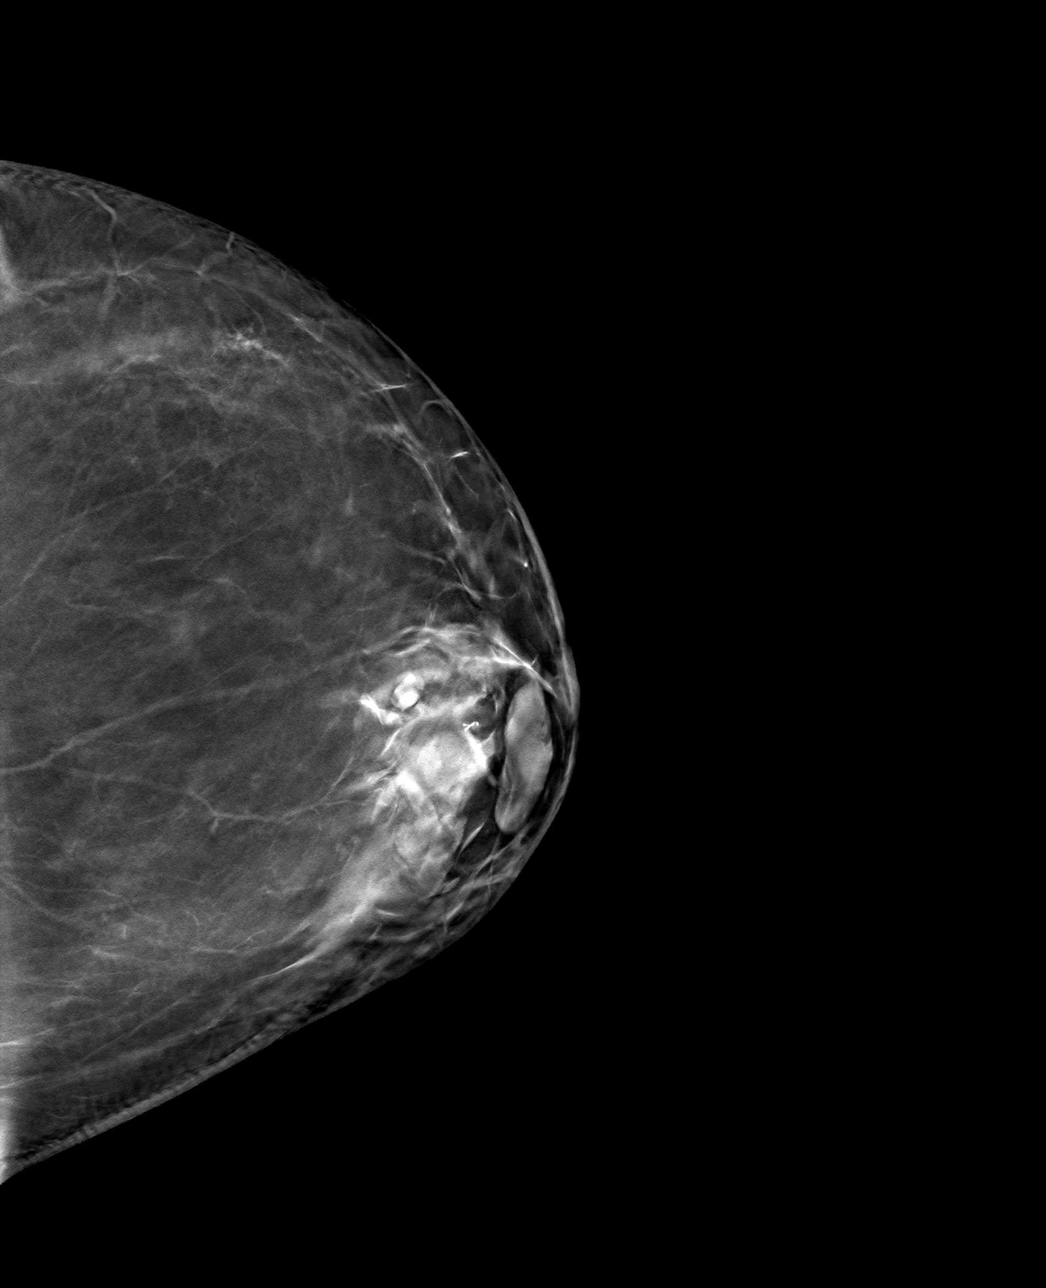

[L ML tomo · tomo slice 43/86.0]
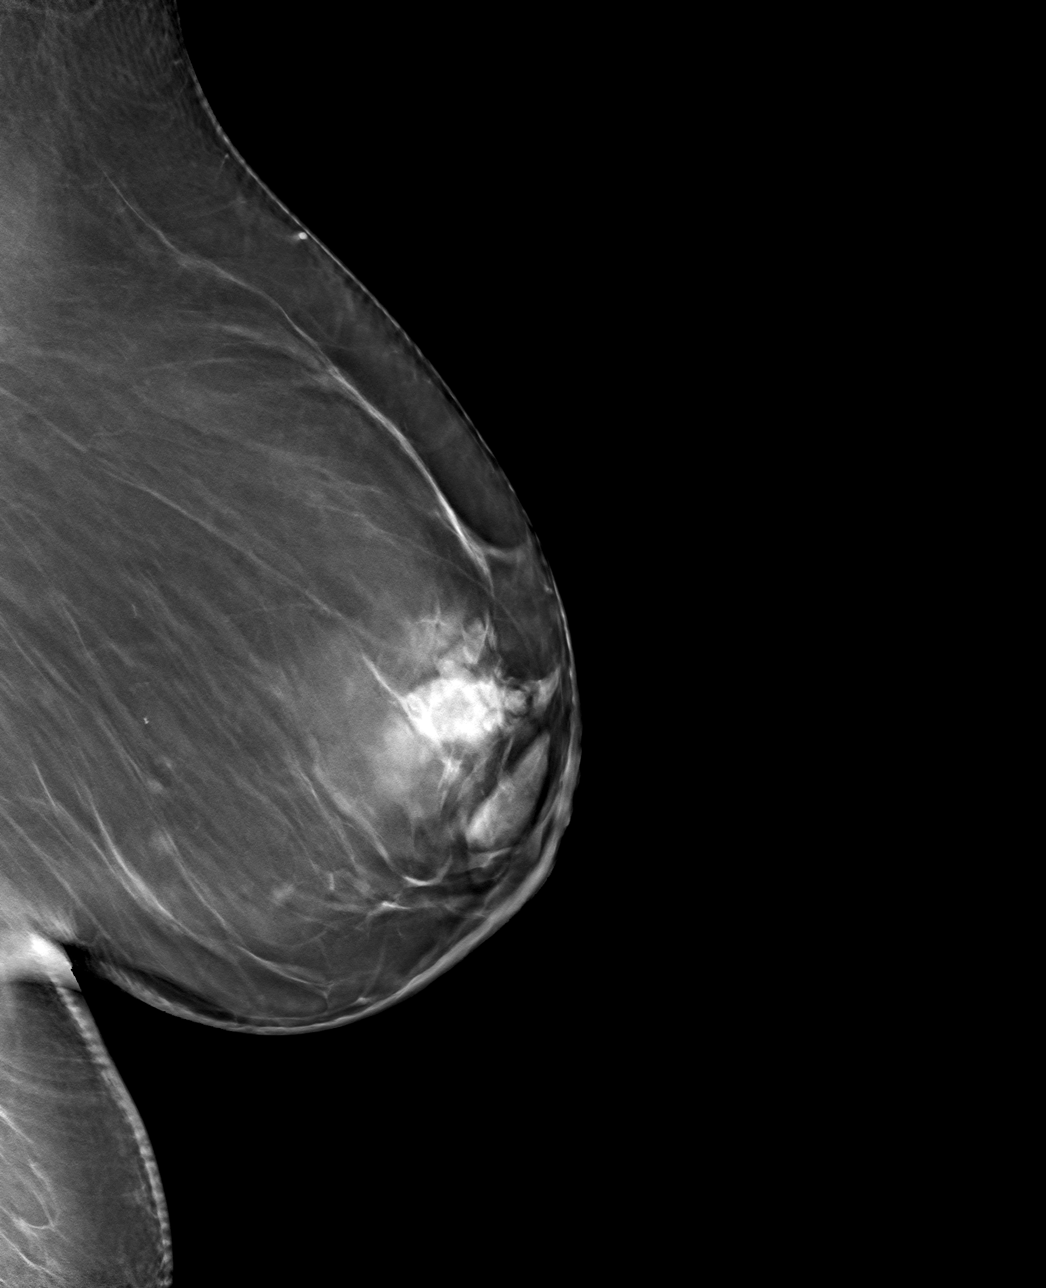

[4 of 12 positions shown; findings below may reference images not displayed]

FINDINGS: Mammographic images were obtained following ultrasound guided biopsy
of left breast mass 10 o'clock position. The biopsy marking clip is
in expected position at the site of biopsy. Note there is a small
hematoma at the biopsy site.
IMPRESSION: Appropriate positioning of the ribbon shaped biopsy marking clip at
the site of biopsy in the left breast 10 o'clock position.

There is a small hematoma at the biopsy site.

Final Assessment: Post Procedure Mammograms for Marker Placement

## 2021-06-21 IMAGING — US US BREAST BX W LOC DEV 1ST LESION IMG BX SPEC US GUIDE*L*
1 series · 6 of 6 positions shown · non-contrast
Comparison: Previous exam(s).
COMPARISON: Previous exam(s).

Addendum:
CLINICAL DATA: Patient with indeterminate left breast mass 10
o'clock position.

EXAM:
ULTRASOUND GUIDED LEFT BREAST CORE NEEDLE BIOPSY

[Series 1: us breast bx w loc dev 1st lesion img bx spec us g · 0.04mm/px · 6 of 6 slices shown]
[im 1/6]
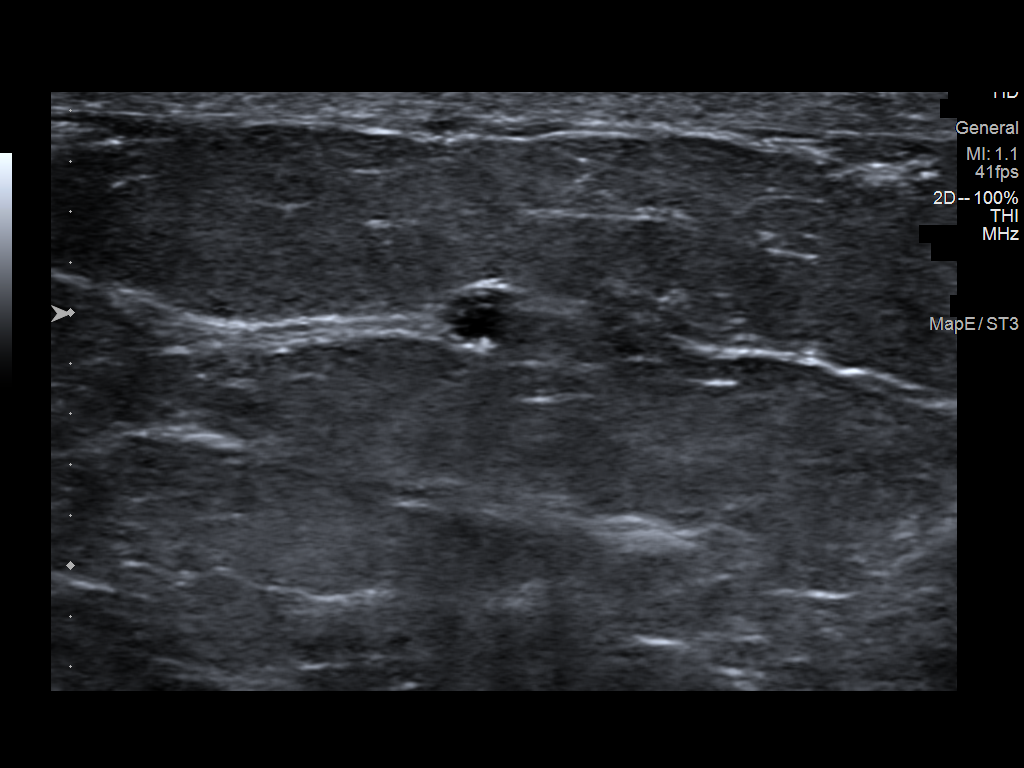
[im 2/6]
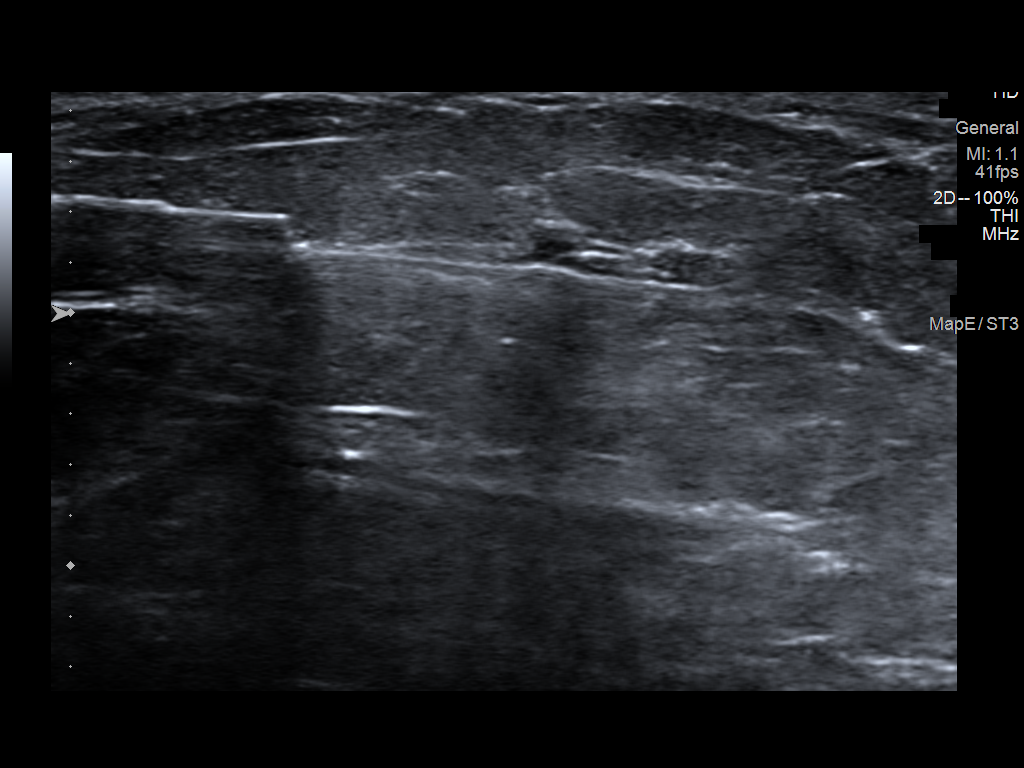
[im 3/6]
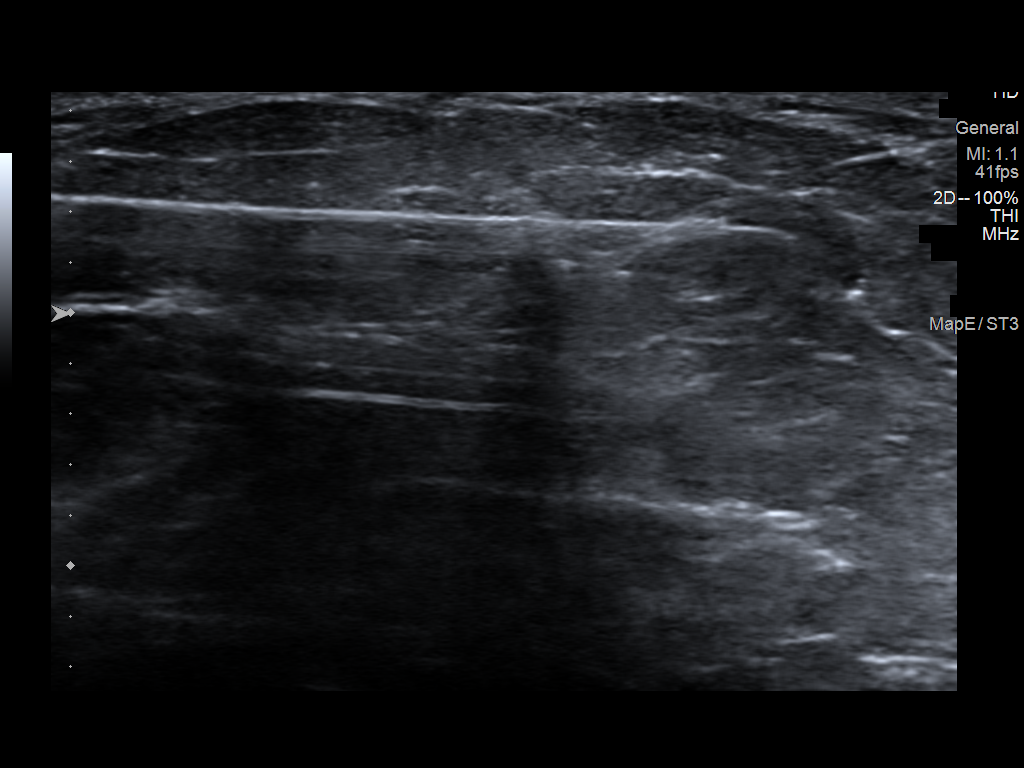
[im 4/6]
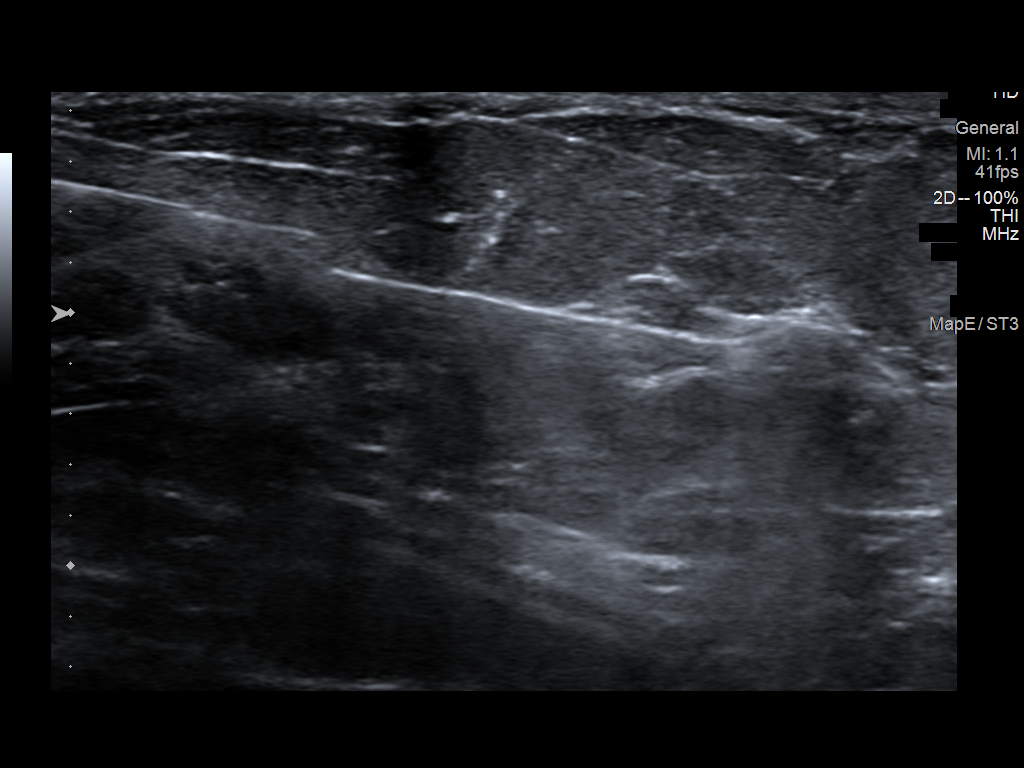
[im 5/6]
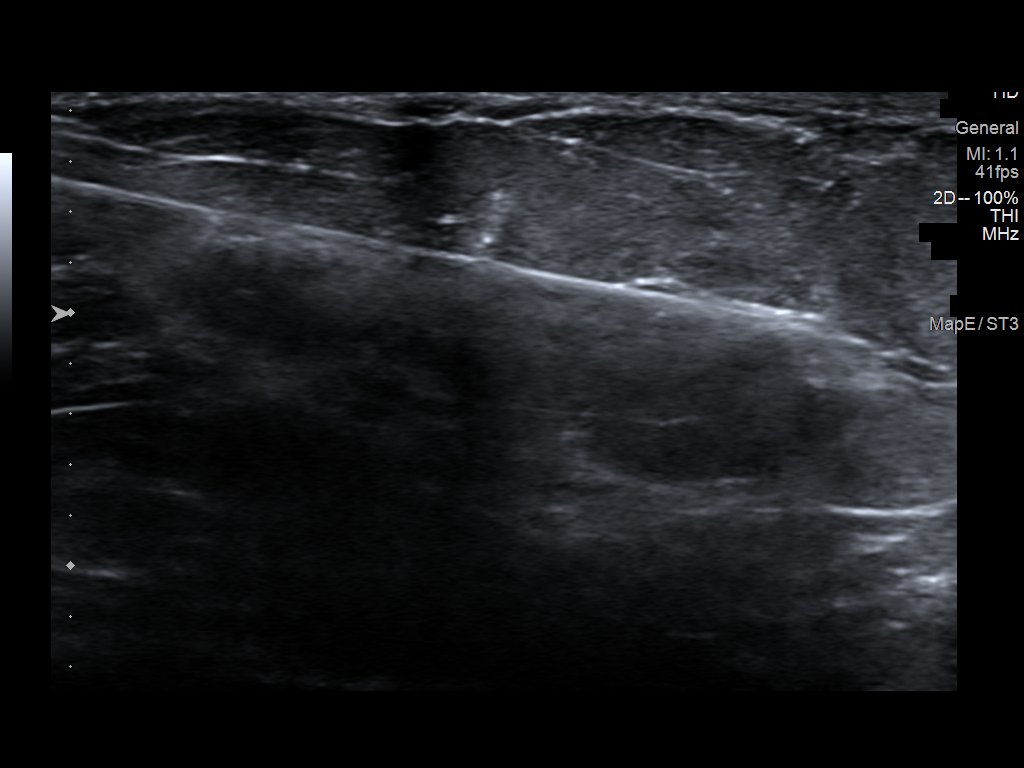
[im 6/6]
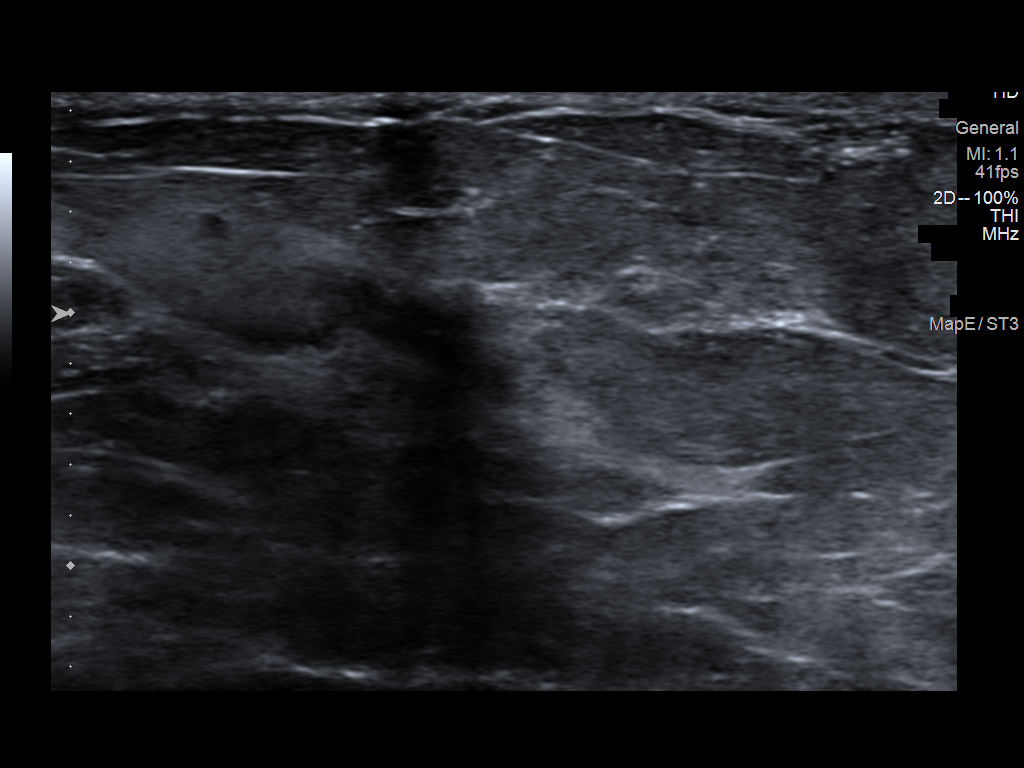

[6 of 6 positions shown; findings below may reference images not displayed]



Lesion quadrant: Upper inner quadrant

Using sterile technique and 1% Lidocaine as local anesthetic, under
direct ultrasound visualization, a 14 gauge Fallon device was
used to perform biopsy of left breast mass 10 o'clock position using
a medial approach. At the conclusion of the procedure ribbon shaped
tissue marker clip was deployed into the biopsy cavity. Follow up 2
view mammogram was performed and dictated separately.
IMPRESSION: Ultrasound guided biopsy of left breast mass 10 o'clock position. No
apparent complications.

ADDENDUM:
Pathology revealed FIBROCYSTIC CHANGES of the Left breast, 10
o'clock. This was found to be concordant by Dr. Zeb Means.

Pathology results were discussed with the patient by telephone. The
patient reported doing well after the biopsy with tenderness,
bruising and swelling at the site. Post biopsy instructions and care
were reviewed and questions were answered. The patient was
encouraged to call The [REDACTED] for any
additional concerns.

The patient was instructed to return for annual screening
mammography due in February 2020.

Pathology results reported by Pattareeya Zaroual, RN on 07/08/2019.



Lesion quadrant: Upper inner quadrant

Using sterile technique and 1% Lidocaine as local anesthetic, under
direct ultrasound visualization, a 14 gauge Fallon device was
used to perform biopsy of left breast mass 10 o'clock position using
a medial approach. At the conclusion of the procedure ribbon shaped
tissue marker clip was deployed into the biopsy cavity. Follow up 2
view mammogram was performed and dictated separately.
IMPRESSION: Ultrasound guided biopsy of left breast mass 10 o'clock position. No
apparent complications.

## 2021-08-03 DIAGNOSIS — L821 Other seborrheic keratosis: Secondary | ICD-10-CM | POA: Diagnosis not present

## 2021-08-03 DIAGNOSIS — T2122XA Burn of second degree of abdominal wall, initial encounter: Secondary | ICD-10-CM | POA: Diagnosis not present

## 2021-08-03 DIAGNOSIS — L400 Psoriasis vulgaris: Secondary | ICD-10-CM | POA: Diagnosis not present

## 2021-08-08 DIAGNOSIS — E538 Deficiency of other specified B group vitamins: Secondary | ICD-10-CM | POA: Diagnosis not present

## 2021-08-08 DIAGNOSIS — E782 Mixed hyperlipidemia: Secondary | ICD-10-CM | POA: Diagnosis not present

## 2021-08-08 DIAGNOSIS — E1169 Type 2 diabetes mellitus with other specified complication: Secondary | ICD-10-CM | POA: Diagnosis not present

## 2021-08-15 DIAGNOSIS — E1169 Type 2 diabetes mellitus with other specified complication: Secondary | ICD-10-CM | POA: Diagnosis not present

## 2021-08-15 DIAGNOSIS — F330 Major depressive disorder, recurrent, mild: Secondary | ICD-10-CM | POA: Insufficient documentation

## 2021-08-15 DIAGNOSIS — Z0000 Encounter for general adult medical examination without abnormal findings: Secondary | ICD-10-CM | POA: Diagnosis not present

## 2021-08-15 DIAGNOSIS — E782 Mixed hyperlipidemia: Secondary | ICD-10-CM | POA: Diagnosis not present

## 2021-08-15 DIAGNOSIS — E538 Deficiency of other specified B group vitamins: Secondary | ICD-10-CM | POA: Diagnosis not present

## 2021-10-10 DIAGNOSIS — J45902 Unspecified asthma with status asthmaticus: Secondary | ICD-10-CM | POA: Diagnosis not present

## 2021-10-10 DIAGNOSIS — E1169 Type 2 diabetes mellitus with other specified complication: Secondary | ICD-10-CM | POA: Diagnosis not present

## 2021-10-10 DIAGNOSIS — J40 Bronchitis, not specified as acute or chronic: Secondary | ICD-10-CM | POA: Diagnosis not present

## 2021-10-10 DIAGNOSIS — E782 Mixed hyperlipidemia: Secondary | ICD-10-CM | POA: Diagnosis not present

## 2021-10-14 ENCOUNTER — Encounter: Payer: Self-pay | Admitting: Emergency Medicine

## 2021-10-14 ENCOUNTER — Other Ambulatory Visit: Payer: Self-pay

## 2021-10-14 ENCOUNTER — Ambulatory Visit
Admission: EM | Admit: 2021-10-14 | Discharge: 2021-10-14 | Disposition: A | Discharge status: Home / Self Care | Payer: HMO

## 2021-10-14 DIAGNOSIS — I10 Essential (primary) hypertension: Secondary | ICD-10-CM

## 2021-10-14 DIAGNOSIS — R519 Headache, unspecified: Secondary | ICD-10-CM

## 2021-10-14 LAB — POCT FASTING CBG KUC MANUAL ENTRY: POCT Glucose (KUC): 130 mg/dL — AB (ref 70–99)

## 2021-10-14 MED ORDER — KETOROLAC TROMETHAMINE 30 MG/ML IJ SOLN
30.0000 mg | Freq: Once | INTRAMUSCULAR | Status: AC
  Administered 2021-10-14: 30 mg via INTRAMUSCULAR

## 2021-10-14 NOTE — ED Provider Notes (Signed)
[facility]    CSN: [id number] Arrival date & time: 10/14/21  0909      History   Chief Complaint Chief Complaint  Patient presents with   Hypertension   Headache    HPI Nicole Garcia is a 76 y.o. female.   HPI Patient with hx of T2DM, HTN, Anxiety, presents today for evaluation of  elevated BP and headache x last night. Patient is prescribed metoprolol 12.5 mg BID although she routinely takes only one time dose as the second dose typically significantly reduces her BP to low 778'E systolic and she routinely only takes a morning dose only of the metoprolol. This morning she has not taken any medication. She continues to have an frontal headache and is experiencing bilateral upper back pain which is a chronic intermittent problem. She is currently being treated for URI infection and has one day remaining of her antibiotic. She denies chest pain, SOB, or dizziness. She felt flushed and mild dizziness last night.   Past Medical History:  Diagnosis Date   Arthritis    Concussion    Depression    Diabetes mellitus without complication ([place])    diet controlled   Diverticulitis    Dysrhythmia    A. Fib hx. 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   B12 deficiency 06/08/2019   Tubular adenoma 06/08/2019   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[place]) 01/21/2018   DDD (degenerative disc disease), lumbar 03/11/2016   Vaginal lesion 05/08/2015   Paronychia, toe 04/06/2015   Renal insufficiency 04/06/2015   Diverticulitis 03/22/2015   Screening for breast cancer 03/22/2015   Obesity (BMI 30-39.9) 03/22/2015   Muscle spasm 01/24/2015   Diabetes mellitus, type 2 ([place]) 01/11/2015   Adjustment disorder with mixed anxiety and depressed mood 12/29/2007   Essential hypertension 12/29/2007   REFLUX  ESOPHAGITIS 12/15/2007   IRRITABLE BOWEL SYNDROME 12/15/2007    Past Surgical History:  Procedure Laterality Date   ABDOMINAL HYSTERECTOMY     BLADDER SUSPENSION  1997   microinvasive Protogen   BREAST EXCISIONAL BIOPSY Left    BREAST EXCISIONAL BIOPSY Right    BREAST SURGERY     biopsy 1966 and 2000   CATARACT EXTRACTION, BILATERAL     CHOLECYSTECTOMY     ROOT CANAL     TOE SURGERY     VAGINAL DELIVERY     x2    OB History   No obstetric history on file.      Home Medications    Prior to Admission medications   Medication Sig Start Date End Date Taking? Authorizing Provider  ALPRAZolam Duanne Moron) 1 MG tablet Take 1 mg by mouth at bedtime as needed.  04/27/19  Yes [provider]  cholecalciferol (VITAMIN D) 1000 UNITS tablet Take 1,000 Units by mouth daily.   Yes [provider]  glimepiride (AMARYL) 1 MG tablet Take 0.5 mg by mouth. Take 1 tab in the am and half tab in the pm   Yes [provider]  metoprolol succinate (TOPROL-XL) 25 MG 24 hr tablet Take 12.5 mg by mouth 2 (two) times daily.   Yes [provider]  Probiotic Product (PROBIOTIC PO) Take 1 tablet by mouth daily.   Yes [provider]  Semaglutide,0.25 or 0.5MG /DOS, (OZEMPIC, 0.25  OR 0.5 MG/DOSE,) 2 MG/1.5ML SOPN Inject 0.25 mg into the skin.   Yes [provider]  bisoprolol-hydrochlorothiazide (ZIAC) 2.5-6.25 MG tablet Take 0.5 tablets by mouth daily.    [provider]  busPIRone (BUSPAR) 5 MG tablet Take 5 mg by mouth 2 (two) times daily. 05/22/21   [provider]  clobetasol cream (TEMOVATE) 0.05 % Apply topically 2 (two) times daily. 01/12/21   [provider]  cyclobenzaprine (FLEXERIL) 10 MG tablet Take 1 tablet (10 mg total) by mouth 3 (three) times daily as needed for muscle spasms. 05/23/21   Hyatt, Max T, DPM  furosemide (LASIX) 20 MG tablet Take 20 mg by mouth as needed.    [provider]  gabapentin (NEURONTIN)  100 MG capsule Take 200 mg by mouth at bedtime. 05/22/21   [provider]  meloxicam (MOBIC) 7.5 MG tablet Take 7.5 mg by mouth daily. 05/22/21   [provider]  metFORMIN (GLUCOPHAGE-XR) 500 MG 24 hr tablet Take 500 mg by mouth daily with breakfast.     [provider]  methocarbamol (ROBAXIN) 500 MG tablet Take 500 mg by mouth 2 (two) times daily as needed. 05/22/21   [provider]  omeprazole (PRILOSEC) 20 MG capsule Take 20 mg by mouth daily.    [provider]  pramipexole (MIRAPEX) 0.5 MG tablet Take 0.5 mg by mouth at bedtime. 05/23/21   [provider]    Family History Family History  Problem Relation Age of Onset   Diabetes Father    Cancer Sister 75       pancreatic   Cancer Brother 22       brain   Cancer Sister        lung   Diabetes Sister    Macular degeneration Sister    Kidney failure Sister    Tremor Sister    Healthy Son     Social History Social History   Tobacco Use   Smoking status: Never   Smokeless tobacco: Never  Vaping Use   Vaping Use: Never used  Substance Use Topics   Alcohol use: No    Alcohol/week: 0.0 standard drinks   Drug use: No     Allergies   Iohexol, 5-alpha reductase inhibitors, Bactroban [mupirocin calcium], Ciprofloxacin, Codeine, Levofloxacin, Linaclotide, Metformin and related, Nitrofurantoin, and Sulfonamide derivatives   Review of Systems Review of Systems Pertinent negatives listed in HPI   Physical Exam Triage Vital Signs ED Triage Vitals  Enc Vitals Group     BP 10/14/21 0919 (!) 173/77     Pulse Rate 10/14/21 0919 99     Resp 10/14/21 0919 18     Temp 10/14/21 0919 98.2 F (36.8 C)     Temp src --      SpO2 10/14/21 0919 97 %     Weight --      Height --      Head Circumference --      Peak Flow --      Pain Score 10/14/21 0933 6     Pain Loc --      Pain Edu? --      Excl. in [place]? --    No data found.  Updated Vital Signs BP (!) 168/78 (BP  Location: Right Arm)    Pulse 99    Temp 98.2 F (36.8 C)    Resp 18    SpO2 97%   Visual Acuity Right Eye Distance:   Left Eye Distance:  Bilateral Distance:    Right Eye Near:   Left Eye Near:    Bilateral Near:     Physical Exam Vitals reviewed.  Constitutional:      Appearance: She is well-developed.  Eyes:     Extraocular Movements: Extraocular movements intact.     Pupils: Pupils are equal, round, and reactive to light.  Cardiovascular:     Rate and Rhythm: Normal rate and regular rhythm.  Pulmonary:     Effort: Pulmonary effort is normal.     Breath sounds: Normal breath sounds.  Musculoskeletal:     Cervical back: Normal range of motion and neck supple.  Skin:    General: Skin is warm and dry.     Capillary Refill: Capillary refill takes less than 2 seconds.  Neurological:     Mental Status: She is alert.     GCS: GCS eye subscore is 4. GCS verbal subscore is 5. GCS motor subscore is 6.  Psychiatric:        Mood and Affect: Mood normal.        Behavior: Behavior normal.     UC Treatments / Results  Labs (all labs ordered are listed, but only abnormal results are displayed) Labs Reviewed  POCT FASTING [facility] MANUAL ENTRY - Abnormal; Notable for the following components:      Result Value   POCT Glucose ([facility]) 130 (*)    All other components within normal limits    EKG NSR, 83 bpm, no acute ST changes   Radiology No results found.  Procedures Procedures (including critical care time)  Medications Ordered in UC Medications  ketorolac (TORADOL) 30 MG/ML injection 30 mg (30 mg Intramuscular Given 10/14/21 1017)    Initial Impression / Assessment and Plan / UC Course  I have reviewed the triage vital signs and the nursing notes.  Pertinent labs & imaging results that were available during my care of the patient were reviewed by me and considered in my medical decision making (see chart for details).    Elevated blood pressure reading with a  diagnosis of hypertension, resume metoprolol BID and monitor BP one hour following taking BP medication  Frontal headache suspect residual sinus pressure causing HA Toradol given IM here. Recommended completing antibiotics.  Strict ER precautions given. Return as needed. Follow-up with PCP this week for follow-up of BP. Final Clinical Impressions(s) / UC Diagnoses   Final diagnoses:  Elevated blood pressure reading in office with diagnosis of hypertension  Frontal headache     Discharge Instructions      Continue to check your blood pressure one hour following taking your medication and I recommend taking the metoprolol twice daily. Schedule a follow-up with your primary care provider this week to re-evaluate your blood pressure. Your EKG is normal and blood sugar is stable. Eat and take you metoprolol when you get home. Suspect headache is related sinuses and Toradol shot should help improve HA and back pain.  If any of your symptoms worsen or becomes severe go immediately to the Emergency Department.     ED Prescriptions   None    PDMP not reviewed this encounter.   Scot Jun, FNP 10/14/21 1034

## 2021-10-14 NOTE — Discharge Instructions (Addendum)
Continue to check your blood pressure one hour following taking your medication and I recommend taking the metoprolol twice daily. Schedule a follow-up with your primary care provider this week to re-evaluate your blood pressure. Your EKG is normal and blood sugar is stable. Eat and take you metoprolol when you get home. Suspect headache is related sinuses and Toradol shot should help improve HA and back pain.  If any of your symptoms worsen or becomes severe go immediately to the Emergency Department.

## 2021-10-14 NOTE — ED Triage Notes (Signed)
Pt here with HTN since last night. The highest systolic at home got to 427, but has mostly been in the 160's. Normally she runs about 062 systolic. Pt checked blood pressure initially because she has a very bad headache starting around last night with flushing of the face and back pain. The headache is still present today. Denies visual changes and dizziness.

## 2021-10-16 DIAGNOSIS — J40 Bronchitis, not specified as acute or chronic: Secondary | ICD-10-CM | POA: Diagnosis not present

## 2021-10-16 DIAGNOSIS — Z23 Encounter for immunization: Secondary | ICD-10-CM | POA: Diagnosis not present

## 2021-10-16 DIAGNOSIS — F330 Major depressive disorder, recurrent, mild: Secondary | ICD-10-CM | POA: Diagnosis not present

## 2021-10-16 DIAGNOSIS — I10 Essential (primary) hypertension: Secondary | ICD-10-CM | POA: Diagnosis not present

## 2021-11-07 DIAGNOSIS — I10 Essential (primary) hypertension: Secondary | ICD-10-CM | POA: Diagnosis not present

## 2021-11-07 DIAGNOSIS — K649 Unspecified hemorrhoids: Secondary | ICD-10-CM | POA: Diagnosis not present

## 2021-11-07 DIAGNOSIS — K5900 Constipation, unspecified: Secondary | ICD-10-CM | POA: Diagnosis not present

## 2021-11-07 DIAGNOSIS — Z9849 Cataract extraction status, unspecified eye: Secondary | ICD-10-CM | POA: Diagnosis not present

## 2021-11-07 DIAGNOSIS — G4700 Insomnia, unspecified: Secondary | ICD-10-CM | POA: Diagnosis not present

## 2021-11-07 DIAGNOSIS — L400 Psoriasis vulgaris: Secondary | ICD-10-CM | POA: Diagnosis not present

## 2021-11-07 DIAGNOSIS — E1165 Type 2 diabetes mellitus with hyperglycemia: Secondary | ICD-10-CM | POA: Diagnosis not present

## 2021-11-07 DIAGNOSIS — E669 Obesity, unspecified: Secondary | ICD-10-CM | POA: Diagnosis not present

## 2021-11-07 DIAGNOSIS — K219 Gastro-esophageal reflux disease without esophagitis: Secondary | ICD-10-CM | POA: Diagnosis not present

## 2021-11-07 DIAGNOSIS — F419 Anxiety disorder, unspecified: Secondary | ICD-10-CM | POA: Diagnosis not present

## 2021-11-13 ENCOUNTER — Telehealth: Payer: Self-pay | Admitting: Cardiovascular Disease

## 2021-11-13 NOTE — Telephone Encounter (Signed)
STAT if HR is under 50 or over 120 ?(normal HR is 60-100 beats per minute) ? ?What is your heart rate? 97 ? ?Do you have a log of your heart rate readings (document readings)? 97 ?111 ? 115 ? 121 day before yesterday ? ?Do you have any other symptoms? A little short winded sometimes ? ?

## 2021-11-13 NOTE — Telephone Encounter (Signed)
Patient was last seen by Dr. Fletcher Anon on 09/05/2017. ? ?She is considered a new pt and will need to scheduled an appt to discuss her symptoms/concerns. ?

## 2021-11-14 ENCOUNTER — Other Ambulatory Visit: Payer: Self-pay

## 2021-11-14 ENCOUNTER — Encounter: Payer: Self-pay | Admitting: Nurse Practitioner

## 2021-11-14 ENCOUNTER — Ambulatory Visit (INDEPENDENT_AMBULATORY_CARE_PROVIDER_SITE_OTHER): Payer: HMO

## 2021-11-14 ENCOUNTER — Ambulatory Visit (INDEPENDENT_AMBULATORY_CARE_PROVIDER_SITE_OTHER): Payer: HMO | Admitting: Nurse Practitioner

## 2021-11-14 VITALS — BP 128/64 | HR 98 | Ht <= 58 in | Wt 179.0 lb

## 2021-11-14 DIAGNOSIS — I10 Essential (primary) hypertension: Secondary | ICD-10-CM

## 2021-11-14 DIAGNOSIS — R002 Palpitations: Secondary | ICD-10-CM

## 2021-11-14 DIAGNOSIS — E782 Mixed hyperlipidemia: Secondary | ICD-10-CM

## 2021-11-14 DIAGNOSIS — E1169 Type 2 diabetes mellitus with other specified complication: Secondary | ICD-10-CM | POA: Diagnosis not present

## 2021-11-14 DIAGNOSIS — R0609 Other forms of dyspnea: Secondary | ICD-10-CM | POA: Diagnosis not present

## 2021-11-14 NOTE — Telephone Encounter (Signed)
Scheduled - add on today  ?

## 2021-11-14 NOTE — Progress Notes (Signed)
? ? ?Office Visit  ?  ?Patient Name: Nicole Garcia ?Date of Encounter: 11/14/2021 ? ?Primary Care Provider:  Rusty Aus, MD ?Primary Cardiologist:  Kathlyn Sacramento, MD ? ?Chief Complaint  ?  ?76 y/o ? w/a h/o atypical chest pain, HTN, diabetes, diast dysfxn, deconditioning, depression, and GERD, who presents for f/u related to palpitations. ? ?Past Medical History  ?  ?Past Medical History:  ?Diagnosis Date  ? Arthritis   ? Atypical chest pain   ? a. 08/2014 Ex MV: EF 70%, no ischemia/infarct; b. 08/2017 ETT: Ex time 3:22. HTN response. No ST/T changes.  ? Concussion   ? Depression   ? Diabetes mellitus without complication ([place])   ? Diastolic dysfunction   ? a. 08/2017 Echo: EF 60%, no rwma, GrI DD, nl RV fxn.  ? Diverticulitis   ? GERD (gastroesophageal reflux disease)   ? History of chicken pox   ? Hyperglycemia   ? Hypertension   ? Physical deconditioning   ? a. 08/2017 ETT: Ex time 3:22.  ? ?Past Surgical History:  ?Procedure Laterality Date  ? ABDOMINAL HYSTERECTOMY    ? BLADDER SUSPENSION  1997  ? microinvasive Protogen  ? BREAST EXCISIONAL BIOPSY Left   ? BREAST EXCISIONAL BIOPSY Right   ? BREAST SURGERY    ? biopsy 1966 and 2000  ? CATARACT EXTRACTION, BILATERAL    ? CHOLECYSTECTOMY    ? ROOT CANAL    ? TOE SURGERY    ? VAGINAL DELIVERY    ? x2  ? ? ?Allergies ? ?Allergies  ?Allergen Reactions  ? Iohexol Anaphylaxis  ?  Pt blacked out after receiving iv contrast in the past  ? 5-Alpha Reductase Inhibitors   ? Bactroban [Mupirocin Calcium]   ? Ciprofloxacin   ? Codeine   ? Levofloxacin   ? Linaclotide   ?  Other reaction(s): Other (See Comments)  ? Metformin And Related   ?  diarrhea  ? Nitrofurantoin   ? Sulfonamide Derivatives   ? ? ?History of Present Illness  ?  ?76 y/o ? w/a h/o atypical chest pain, HTN, diabetes, diast dysfxn, deconditioning, depression, and GERD.  She was previously evaluated with an exercise Myoview in January 2016, which showed normal LV function without ischemia or infarct.  At  her last cardiology visit in January 2019, she reported dyspnea on exertion and left lower extremity swelling following a trip from [place].  D-dimer was elevated and she was referred for V:Q scan, which was low probability for PE.  Lower extremity venous duplex was negative for DVT.  She subsequently underwent echocardiogram which showed an EF of 60% with grade 1 diastolic dysfunction and normal RV function.  Exercise treadmill testing was also carried out showing deconditioning with an exercise time of only 3 minutes and 22 seconds, a hypertensive response to exercise, but no ST or T changes. ? ?Patient subsequently did well from a cardiac standpoint and remained active.  In July 2022, she lost her husband in the setting of GI bleeding, which was sudden and unexpected.  She has had significant anxiety and depression since then, and notes that she at times feels paranoid about her health.  She wears an Visual merchandiser and has been paying close attention to her heart rate.  She will check it periodically through the out the day and becomes concerned about fluctuations, even in the absence of symptoms.  She has also been checking her blood pressure frequently and in late February, noted elevated blood  pressures prompting her to be seen at urgent care.  She was subsequently advised to follow-up with her primary care provider was placed on amlodipine 2.5 mg daily.  She also takes Toprol-XL 12.5 mg twice daily.  Since starting amlodipine, she has had periodic low blood pressures but for the most part, her blood pressure trends have been in the 120s to mid 130 range.  She has intermittently noted palpitations, especially at night, which are not associated with any other symptoms but do create anxiety.  She recently started walking again after not doing it for some time, and noted dyspnea on exertion with an elevation in her heart rate to about 120 bpm.  She continued to walk without any worsening of dyspnea or further  elevation of heart rate and after about 20 minutes, rested with normalization of heart rate and resolution of dyspnea.  At no point did she experience chest pain.  She denies PND, orthopnea, dizziness, syncope, edema, or early satiety. ? ?Home Medications  ?  ?Prior to Admission medications   ?Medication Sig Start Date End Date Taking? Authorizing Provider  ?ALPRAZolam (XANAX) 1 MG tablet Take 1 mg by mouth at bedtime as needed.  04/27/19  Yes [provider]  ?amLODipine (NORVASC) 5 MG tablet Take 5 mg by mouth daily. 10/15/21 10/15/22 Yes [provider]  ?cholecalciferol (VITAMIN D) 1000 UNITS tablet Take 1,000 Units by mouth daily.   Yes [provider]  ?glimepiride (AMARYL) 1 MG tablet Take 0.5 mg by mouth. Take 1 tab in the am and half tab in the pm   Yes [provider]  ?metoprolol succinate (TOPROL-XL) 25 MG 24 hr tablet Take 1/2 tablet twice daily   Yes [provider]  ?omeprazole (PRILOSEC) 20 MG capsule Take 20 mg by mouth daily.   Yes [provider]  ?Probiotic Product (PROBIOTIC PO) Take 1 tablet by mouth daily.   Yes [provider]  ?Semaglutide,0.25 or 0.'5MG'$ /DOS, (OZEMPIC, 0.25 OR 0.5 MG/DOSE,) 2 MG/1.5ML SOPN Inject 0.25 mg into the skin.   Yes [provider]  ?  ?  ?Family History  ?  ?Family History  ?Problem Relation Age of Onset  ? Diabetes Father   ? Cancer Sister 46  ?     pancreatic  ? Cancer Brother 72  ?     brain  ? Cancer Sister   ?     lung  ? Diabetes Sister   ? Macular degeneration Sister   ? Kidney failure Sister   ? Tremor Sister   ? Healthy Son   ?  ? ?Social History  ?  ?Social History  ? ?Socioeconomic History  ? Marital status: Widowed  ?  Spouse name: Not on file  ? Number of children: 2  ? Years of education: Not on file  ? Highest education level: High school graduate  ?Occupational History  ? Not on file  ?Tobacco Use  ? Smoking status: Never  ? Smokeless tobacco: Never  ?Vaping Use  ? Vaping Use: Never  used  ?Substance and Sexual Activity  ? Alcohol use: No  ?  Alcohol/week: 0.0 standard drinks  ? Drug use: No  ? Sexual activity: Not on file  ?Other Topics Concern  ? Not on file  ?Social History Narrative  ? Lives in [place].  Husband passed in July 2022 in the setting of GI bleeding and she has been very anxious and concerned about her health ever since.  Does not like living  alone.  Has dog in home. Son lives in [place] and [place].  ?   ? Work - [employer], owned 25years.  ?   ? ?Social Determinants of Health  ? ?Financial Resource Strain: Not on file  ?Food Insecurity: Not on file  ?Transportation Needs: Not on file  ?Physical Activity: Not on file  ?Stress: Not on file  ?Social Connections: Not on file  ?Intimate Partner Violence: Not on file  ?  ? ?Review of Systems  ?  ?Since the death of her husband, she has had anxiety and depression.  More recently, she has noted elevations in heart rates and occasional palpitations.  She has also been nervous about elevations in blood pressure.  She has dyspnea on exertion.  She denies chest pain, PND, orthopnea, dizziness, syncope, edema, or early satiety.  All other systems reviewed and are otherwise negative except as noted above. ?  ? ?Physical Exam  ?  ?VS:  BP 140/62 (BP Location: Left Arm, Patient Position: Sitting, Cuff Size: Large)   Pulse 98   Ht '4\' 10"'$  (1.473 m)   Wt 179 lb (81.2 kg)   BMI 37.41 kg/m?  , BMI Body mass index is 37.41 kg/m?. ?    ?Vitals:  ? 11/14/21 1109 11/14/21 1254  ?BP: 140/62 128/64  ?Pulse: 98   ?  ?GEN: Obese, in no acute distress. ?HEENT: normal. ?Neck: Supple, obese, difficult to gauge JVP.  No bruits or masses. ?Cardiac: RRR, no murmurs, rubs, or gallops. No clubbing, cyanosis, edema.  Radials/PT 2+ and equal bilaterally.  ?Respiratory:  Respirations regular and unlabored, clear to auscultation bilaterally. ?GI: Soft, nontender, nondistended, BS + x 4. ?MS: no deformity or atrophy. ?Skin: warm and dry, no rash. ?Neuro:  Strength  and sensation are intact. ?Psych: Normal affect. ? ?Accessory Clinical Findings  ?  ?ECG personally reviewed by me today -regular sinus rhythm, 84 - no acute changes. ? ?Labs dated August 08, 2021 from care ev

## 2021-11-14 NOTE — Patient Instructions (Signed)
Medication Instructions:  ?No changes at this time. ? ?*If you need a refill on your cardiac medications before your next appointment, please call your pharmacy* ? ? ?Lab Work: ?None ? ?If you have labs (blood work) drawn today and your tests are completely normal, you will receive your results only by: ?MyChart Message (if you have MyChart) OR ?A paper copy in the mail ?If you have any lab test that is abnormal or we need to change your treatment, we will call you to review the results. ? ? ?Testing/Procedures: ?Your provider has ordered a heart monitor to wear for 1 week. This will be mailed to your home with instructions on placement. Once you have finished the time frame requested, you will return monitor in box provided. ? ? ? ? ? ?Follow-Up: ?At [facility], you and your health needs are our priority.  As part of our continuing mission to provide you with exceptional heart care, we have created designated Provider Care Teams.  These Care Teams include your primary Cardiologist (physician) and Advanced Practice Providers (APPs -  Physician Assistants and Nurse Practitioners) who all work together to provide you with the care you need, when you need it. ? ?We recommend signing up for the patient portal called "MyChart".  Sign up information is provided on this After Visit Summary.  MyChart is used to connect with patients for Virtual Visits (Telemedicine).  Patients are able to view lab/test results, encounter notes, upcoming appointments, etc.  Non-urgent messages can be sent to your provider as well.   ?To learn more about what you can do with MyChart, go to [email or web address].   ? ?Your next appointment:   ?1 month(s) ? ?The format for your next appointment:   ?In Person ? ?Provider:   ?Kathlyn Sacramento, MD or Murray Hodgkins, NP ?

## 2021-11-16 DIAGNOSIS — R002 Palpitations: Secondary | ICD-10-CM

## 2021-12-01 DIAGNOSIS — R002 Palpitations: Secondary | ICD-10-CM | POA: Diagnosis not present

## 2021-12-05 ENCOUNTER — Telehealth: Payer: Self-pay | Admitting: Cardiovascular Disease

## 2021-12-05 NOTE — Telephone Encounter (Signed)
Pt is calling to get heart monitor results. Please advise ?

## 2021-12-05 NOTE — Telephone Encounter (Signed)
Reviewed with patient that results are pending provider review. Advised that once it results then we will give her a call with those results. She verbalized understanding with no further questions at this time.  ?

## 2021-12-18 ENCOUNTER — Encounter: Payer: Self-pay | Admitting: Cardiovascular Disease

## 2021-12-18 ENCOUNTER — Ambulatory Visit: Payer: HMO | Admitting: Cardiovascular Disease

## 2021-12-18 VITALS — BP 148/64 | HR 99 | Ht <= 58 in | Wt 182.4 lb

## 2021-12-18 DIAGNOSIS — R002 Palpitations: Secondary | ICD-10-CM | POA: Diagnosis not present

## 2021-12-18 DIAGNOSIS — I10 Essential (primary) hypertension: Secondary | ICD-10-CM | POA: Diagnosis not present

## 2021-12-18 MED ORDER — METOPROLOL SUCCINATE ER 25 MG PO TB24: 25.0000 mg | ORAL_TABLET | Freq: Two times a day (BID) | ORAL | 3 refills | Status: DC

## 2021-12-18 MED ORDER — AMLODIPINE BESYLATE 2.5 MG PO TABS: 2.5000 mg | ORAL_TABLET | Freq: Every day | ORAL | 3 refills | Status: DC

## 2021-12-18 NOTE — Progress Notes (Signed)
?  ?Cardiology Office Note ? ? ?Date:  12/18/2021  ? ?ID:  Nicole Garcia, DOB 1946-01-25, MRN [id number] ? ?PCP:  Rusty Aus, MD  ?Cardiologist:   Kathlyn Sacramento, MD  ? ?Chief Complaint  ?Patient presents with  ? Other  ?  1 month F/u zio pt recently d/c ozempic due to headaches and felt like a burning sensation/fever. Meds reviewed verbally with pt.  ? ? ?  ?History of Present Illness: ?Nicole Garcia is a 76 y.o. female who presents for a follow-up visit regarding palpitations and hypertension.   She has known history of hypertension, type 2 diabetes and GERD. ?She was seen by me in 2016 for atypical chest pain and shortness of breath. A treadmill nuclear stress test in January 2016  showed no evidence of ischemia with normal ejection fraction. Echocardiogram was overall unremarkable except for mild diastolic dysfunction.  ? ?Unfortunately, her husband died in 03-07-2021 due to GI bleed.  She had significant anxiety and depression since then.  She started having elevated blood pressure readings and was placed on amlodipine 2.5 mg once daily and Toprol 12.5 mg twice daily.  She was seen in our office for increased palpitation.  She underwent 1 week outpatient monitor which showed sinus rhythm with rare PACs and rare PVCs.  No significant arrhythmia. ? ?She feels better after she stopped taking Ozempic.  She felt that most of her symptoms were side effects.  Nonetheless, she continues to report some palpitations.  No chest pain or shortness of breath. ? ? ?Past Medical History:  ?Diagnosis Date  ? Arthritis   ? Atypical chest pain   ? a. 08/2014 Ex MV: EF 70%, no ischemia/infarct; b. 08/2017 ETT: Ex time 3:22. HTN response. No ST/T changes.  ? Concussion   ? Depression   ? Diabetes mellitus without complication ([place])   ? Diastolic dysfunction   ? a. 08/2017 Echo: EF 60%, no rwma, GrI DD, nl RV fxn.  ? Diverticulitis   ? GERD (gastroesophageal reflux disease)   ? History of chicken pox   ? Hyperglycemia   ?  Hypertension   ? Physical deconditioning   ? a. 08/2017 ETT: Ex time 3:22.  ? ? ?Past Surgical History:  ?Procedure Laterality Date  ? ABDOMINAL HYSTERECTOMY    ? BLADDER SUSPENSION  1997  ? microinvasive Protogen  ? BREAST EXCISIONAL BIOPSY Left   ? BREAST EXCISIONAL BIOPSY Right   ? BREAST SURGERY    ? biopsy 1966 and 2000  ? CATARACT EXTRACTION, BILATERAL    ? CHOLECYSTECTOMY    ? ROOT CANAL    ? TOE SURGERY    ? VAGINAL DELIVERY    ? x2  ? ? ? ?Current Outpatient Medications  ?Medication Sig Dispense Refill  ? ALPRAZolam (XANAX) 1 MG tablet Take 1 mg by mouth at bedtime as needed.     ? amLODipine (NORVASC) 5 MG tablet Take 5 mg by mouth daily.    ? cholecalciferol (VITAMIN D) 1000 UNITS tablet Take 1,000 Units by mouth daily.    ? glimepiride (AMARYL) 1 MG tablet Take 0.5 mg by mouth. Take 1 tab in the am and half tab in the pm    ? metoprolol succinate (TOPROL-XL) 25 MG 24 hr tablet Take 1/2 tablet in the AM and a whole tablet in the PM    ? omeprazole (PRILOSEC) 20 MG capsule Take 20 mg by mouth daily.    ? Probiotic Product (PROBIOTIC PO) Take 1  tablet by mouth daily.    ? ?No current facility-administered medications for this visit.  ? ? ?Allergies:   Iohexol, 5-alpha reductase inhibitors, Bactroban [mupirocin calcium], Ciprofloxacin, Codeine, Levofloxacin, Linaclotide, Metformin and related, Nitrofurantoin, and Sulfonamide derivatives  ? ? ?Social History:  The patient  reports that she has never smoked. She has never used smokeless tobacco. She reports that she does not drink alcohol and does not use drugs.  ? ?Family History:  The patient's family history includes Cancer in her sister; Cancer (age of onset: 74) in her brother; Cancer (age of onset: 21) in her sister; Diabetes in her father and sister; Healthy in her son; Kidney failure in her sister; Macular degeneration in her sister; Tremor in her sister.  ? ? ?ROS:  Please see the history of present illness.   Otherwise, review of systems are positive  for none.   All other systems are reviewed and negative.  ? ? ?PHYSICAL EXAM: ?VS:  BP (!) 148/64 (BP Location: Left Arm, Patient Position: Sitting, Cuff Size: Large)   Pulse 99   Ht '4\' 10"'$  (1.473 m)   Wt 182 lb 6 oz (82.7 kg)   SpO2 98%   BMI 38.12 kg/m?  , BMI Body mass index is 38.12 kg/m?. ?GEN: Well nourished, well developed, in no acute distress  ?HEENT: normal  ?Neck: no JVD, carotid bruits, or masses ?Cardiac: RRR; no murmurs, rubs, or gallops.  Mild left leg swelling with no tenderness ?Respiratory:  clear to auscultation bilaterally, normal work of breathing ?GI: soft, nontender, nondistended, + BS ?MS: no deformity or atrophy  ?Skin: warm and dry, no rash ?Neuro:  Strength and sensation are intact ?Psych: euthymic mood, full affect ? ? ?EKG:  EKG is not ordered today. ?Her husband was ? ? ?Recent Labs: ?No results found for requested labs within last 8760 hours.  ? ? ?Lipid Panel ?   ?Component Value Date/Time  ? CHOL 207 (H) 03/31/2015 1055  ? TRIG 383.0 (H) 03/31/2015 1055  ? HDL 37.70 (L) 03/31/2015 1055  ? CHOLHDL 5 03/31/2015 1055  ? VLDL 76.6 (H) 03/31/2015 1055  ? LDLDIRECT 101.0 03/31/2015 1055  ? ?  ? ?Wt Readings from Last 3 Encounters:  ?12/18/21 182 lb 6 oz (82.7 kg)  ?11/14/21 179 lb (81.2 kg)  ?05/02/20 158 lb (71.7 kg)  ?  ? ? ?   ? View : No data to display.  ?  ?  ?  ? ? ? ? ?ASSESSMENT AND PLAN: ? ?1.  Palpitations: Recent monitor showed no significant arrhythmia but her resting heart rate was on the high side with rare PACs and rare PVCs.  Symptoms improved after she stopped Ozempic but still with residual palpitations.  I elected to increase Toprol to 25 mg twice daily. ? ?2.  Essential hypertension: Blood pressure is controlled on current medications.  I reviewed home blood pressure readings and most of them are in the acceptable range.  I refilled amlodipine 2.5 mg once daily. ? ? ? ?Disposition:   FU in 6 months. ? ?Signed, ? ?Kathlyn Sacramento, MD  ?12/18/2021 2:21 PM    ?[place] ?

## 2021-12-18 NOTE — Patient Instructions (Signed)
Medication Instructions:  ?Your physician has recommended you make the following change in your medication:  ? ?INCREASE Metoprolol to 25 mg twice a day. An Rx has been sent to your pharmacy. ? ?*If you need a refill on your cardiac medications before your next appointment, please call your pharmacy* ? ? ?Lab Work: ?None ordered ?If you have labs (blood work) drawn today and your tests are completely normal, you will receive your results only by: ?MyChart Message (if you have MyChart) OR ?A paper copy in the mail ?If you have any lab test that is abnormal or we need to change your treatment, we will call you to review the results. ? ? ?Testing/Procedures: ?None ordered ? ? ?Follow-Up: ?At [facility], you and your health needs are our priority.  As part of our continuing mission to provide you with exceptional heart care, we have created designated Provider Care Teams.  These Care Teams include your primary Cardiologist (physician) and Advanced Practice Providers (APPs -  Physician Assistants and Nurse Practitioners) who all work together to provide you with the care you need, when you need it. ? ?We recommend signing up for the patient portal called "MyChart".  Sign up information is provided on this After Visit Summary.  MyChart is used to connect with patients for Virtual Visits (Telemedicine).  Patients are able to view lab/test results, encounter notes, upcoming appointments, etc.  Non-urgent messages can be sent to your provider as well.   ?To learn more about what you can do with MyChart, go to [email or web address].   ? ?Your next appointment:   ?Your physician wants you to follow-up in: 6 months You will receive a reminder letter in the mail two months in advance. If you don't receive a letter, please call our office to schedule the follow-up appointment. ? ? ?The format for your next appointment:   ?In Person ? ?Provider:   ?You may see Kathlyn Sacramento, MD or one of the following Advanced Practice  Providers on your designated Care Team:   ?Murray Hodgkins, NP ?Christell Faith, PA-C ?Cadence Kathlen Mody, PA-C ? ? ?Other Instructions ?N/A ? ?Important Information About Sugar ? ? ? ? ? ? ?

## 2022-01-18 DIAGNOSIS — D225 Melanocytic nevi of trunk: Secondary | ICD-10-CM | POA: Diagnosis not present

## 2022-01-18 DIAGNOSIS — L400 Psoriasis vulgaris: Secondary | ICD-10-CM | POA: Diagnosis not present

## 2022-01-18 DIAGNOSIS — K130 Diseases of lips: Secondary | ICD-10-CM | POA: Diagnosis not present

## 2022-01-18 DIAGNOSIS — D2272 Melanocytic nevi of left lower limb, including hip: Secondary | ICD-10-CM | POA: Diagnosis not present

## 2022-01-18 DIAGNOSIS — D2261 Melanocytic nevi of right upper limb, including shoulder: Secondary | ICD-10-CM | POA: Diagnosis not present

## 2022-02-06 DIAGNOSIS — E782 Mixed hyperlipidemia: Secondary | ICD-10-CM | POA: Diagnosis not present

## 2022-02-06 DIAGNOSIS — E538 Deficiency of other specified B group vitamins: Secondary | ICD-10-CM | POA: Diagnosis not present

## 2022-02-06 DIAGNOSIS — E1169 Type 2 diabetes mellitus with other specified complication: Secondary | ICD-10-CM | POA: Diagnosis not present

## 2022-02-13 DIAGNOSIS — E538 Deficiency of other specified B group vitamins: Secondary | ICD-10-CM | POA: Diagnosis not present

## 2022-02-13 DIAGNOSIS — F330 Major depressive disorder, recurrent, mild: Secondary | ICD-10-CM | POA: Diagnosis not present

## 2022-02-13 DIAGNOSIS — E1169 Type 2 diabetes mellitus with other specified complication: Secondary | ICD-10-CM | POA: Diagnosis not present

## 2022-02-13 DIAGNOSIS — Z0000 Encounter for general adult medical examination without abnormal findings: Secondary | ICD-10-CM | POA: Diagnosis not present

## 2022-02-13 DIAGNOSIS — E782 Mixed hyperlipidemia: Secondary | ICD-10-CM | POA: Diagnosis not present

## 2022-02-27 ENCOUNTER — Other Ambulatory Visit: Payer: Self-pay | Admitting: Internal Medicine

## 2022-02-27 DIAGNOSIS — Z1231 Encounter for screening mammogram for malignant neoplasm of breast: Secondary | ICD-10-CM

## 2022-03-12 DIAGNOSIS — R600 Localized edema: Secondary | ICD-10-CM | POA: Diagnosis not present

## 2022-03-12 DIAGNOSIS — M5116 Intervertebral disc disorders with radiculopathy, lumbar region: Secondary | ICD-10-CM | POA: Diagnosis not present

## 2022-04-01 ENCOUNTER — Ambulatory Visit
Admission: RE | Admit: 2022-04-01 | Discharge: 2022-04-01 | Disposition: A | Discharge status: Home / Self Care | Payer: HMO | Source: Ambulatory Visit | Attending: Internal Medicine | Admitting: Internal Medicine

## 2022-04-01 DIAGNOSIS — Z1231 Encounter for screening mammogram for malignant neoplasm of breast: Secondary | ICD-10-CM

## 2022-04-24 DIAGNOSIS — Z961 Presence of intraocular lens: Secondary | ICD-10-CM | POA: Diagnosis not present

## 2022-04-24 DIAGNOSIS — E119 Type 2 diabetes mellitus without complications: Secondary | ICD-10-CM | POA: Diagnosis not present

## 2022-04-24 DIAGNOSIS — H524 Presbyopia: Secondary | ICD-10-CM | POA: Diagnosis not present

## 2022-06-20 ENCOUNTER — Ambulatory Visit: Payer: HMO | Attending: Cardiovascular Disease | Admitting: Cardiovascular Disease

## 2022-06-20 ENCOUNTER — Encounter: Payer: Self-pay | Admitting: Cardiovascular Disease

## 2022-06-20 VITALS — BP 130/60 | HR 88 | Ht <= 58 in | Wt 181.4 lb

## 2022-06-20 DIAGNOSIS — R0609 Other forms of dyspnea: Secondary | ICD-10-CM | POA: Diagnosis not present

## 2022-06-20 DIAGNOSIS — R002 Palpitations: Secondary | ICD-10-CM | POA: Diagnosis not present

## 2022-06-20 DIAGNOSIS — I10 Essential (primary) hypertension: Secondary | ICD-10-CM | POA: Diagnosis not present

## 2022-06-20 NOTE — Progress Notes (Signed)
Cardiology Office Note   Date:  06/20/2022   ID:  Jaimi, Belle Jul 13, 1946, MRN [id number]  PCP:  Rusty Aus, MD  Cardiologist:   Kathlyn Sacramento, MD   Chief Complaint  Patient presents with   Other    6 Month f/u no complaints today. Meds reviewed verbally with pt.      History of Present Illness: Nicole Garcia is a 76 y.o. female who presents for a follow-up visit regarding palpitations and hypertension.   She has known history of hypertension, type 2 diabetes and GERD. She was seen by me in 2016 for atypical chest pain and shortness of breath. A treadmill nuclear stress test in January 2016  showed no evidence of ischemia with normal ejection fraction. Echocardiogram was overall unremarkable except for mild diastolic dysfunction.   Unfortunately, her husband died in 18-Mar-2021 due to GI bleed.  She had significant anxiety and depression since then.  She started having elevated blood pressure readings and was placed on amlodipine 2.5 mg once daily and Toprol 12.5 mg twice daily.  She was seen in our office for increased palpitation.  She underwent 1 week outpatient monitor which showed sinus rhythm with rare PACs and rare PVCs.  No significant arrhythmia.  She has been doing very well with no recurrent palpitations.  No chest pain or worsening dyspnea.  She reports cramps in her legs.  Past Medical History:  Diagnosis Date   Arthritis    Atypical chest pain    a. 08/2014 Ex MV: EF 70%, no ischemia/infarct; b. 08/2017 ETT: Ex time 3:22. HTN response. No ST/T changes.   Concussion    Depression    Diabetes mellitus without complication ([place])    Diastolic dysfunction    a. 08/2017 Echo: EF 60%, no rwma, GrI DD, nl RV fxn.   Diverticulitis    GERD (gastroesophageal reflux disease)    History of chicken pox    Hyperglycemia    Hypertension    Physical deconditioning    a. 08/2017 ETT: Ex time 3:22.    Past Surgical History:  Procedure Laterality Date   ABDOMINAL  HYSTERECTOMY     BLADDER SUSPENSION  1997   microinvasive Protogen   BREAST EXCISIONAL BIOPSY Left    BREAST EXCISIONAL BIOPSY Right    BREAST SURGERY     biopsy 1966 and 2000   CATARACT EXTRACTION, BILATERAL     CHOLECYSTECTOMY     ROOT CANAL     TOE SURGERY     VAGINAL DELIVERY     x2     Current Outpatient Medications  Medication Sig Dispense Refill   Acetaminophen (TYLENOL PO) Take by mouth. Am and pm as needed.     ALPRAZolam (XANAX) 1 MG tablet Take 1 mg by mouth at bedtime as needed.      amLODipine (NORVASC) 2.5 MG tablet Take 1 tablet (2.5 mg total) by mouth daily. 90 tablet 3   cholecalciferol (VITAMIN D) 1000 UNITS tablet Take 1,000 Units by mouth daily.     empagliflozin (JARDIANCE) 25 MG TABS tablet Take 25 mg by mouth daily.     gabapentin (NEURONTIN) 100 MG capsule Take 100 mg by mouth at bedtime.     glimepiride (AMARYL) 1 MG tablet Take 0.5 mg by mouth. Take 1 tab in the am and half tab in the pm     metoprolol succinate (TOPROL-XL) 25 MG 24 hr tablet Take 1 tablet (25 mg total) by mouth 2 (two)  times daily. (Patient taking differently: Take 12.5 mg by mouth 2 (two) times daily.) 180 tablet 3   Multiple Vitamin (MULTIVITAMIN) capsule Take 1 capsule by mouth daily.     Omega-3 Fatty Acids (FISH OIL PO) Take by mouth 2 (two) times daily at 10 AM and 5 PM.     omeprazole (PRILOSEC) 20 MG capsule Take 20 mg by mouth daily.     Probiotic Product (PROBIOTIC PO) Take 1 tablet by mouth daily.     No current facility-administered medications for this visit.    Allergies:   Iohexol, 5-alpha reductase inhibitors, Bactroban [mupirocin calcium], Ciprofloxacin, Codeine, Levofloxacin, Linaclotide, Metformin and related, Nitrofurantoin, and Sulfonamide derivatives    Social History:  The patient  reports that she has never smoked. She has never used smokeless tobacco. She reports that she does not drink alcohol and does not use drugs.   Family History:  The patient's family  history includes Cancer in her sister; Cancer (age of onset: 66) in her brother; Cancer (age of onset: 33) in her sister; Diabetes in her father and sister; Healthy in her son; Kidney failure in her sister; Macular degeneration in her sister; Tremor in her sister.    ROS:  Please see the history of present illness.   Otherwise, review of systems are positive for none.   All other systems are reviewed and negative.    PHYSICAL EXAM: VS:  BP 130/60 (BP Location: Left Arm, Patient Position: Sitting, Cuff Size: Normal)   Pulse 88   Ht '4\' 10"'$  (1.473 m)   Wt 181 lb 6 oz (82.3 kg)   SpO2 97%   BMI 37.91 kg/m  , BMI Body mass index is 37.91 kg/m. GEN: Well nourished, well developed, in no acute distress  HEENT: normal  Neck: no JVD, carotid bruits, or masses Cardiac: RRR; no murmurs, rubs, or gallops.  Mild left leg swelling with no tenderness Respiratory:  clear to auscultation bilaterally, normal work of breathing GI: soft, nontender, nondistended, + BS MS: no deformity or atrophy  Skin: warm and dry, no rash Neuro:  Strength and sensation are intact Psych: euthymic mood, full affect She has palpable distal pulses.  EKG:  EKG is  ordered today. EKG showed sinus rhythm with low voltage   Recent Labs: No results found for requested labs within last 365 days.    Lipid Panel    Component Value Date/Time   CHOL 207 (H) 03/31/2015 1055   TRIG 383.0 (H) 03/31/2015 1055   HDL 37.70 (L) 03/31/2015 1055   CHOLHDL 5 03/31/2015 1055   VLDL 76.6 (H) 03/31/2015 1055   LDLDIRECT 101.0 03/31/2015 1055      Wt Readings from Last 3 Encounters:  06/20/22 181 lb 6 oz (82.3 kg)  12/18/21 182 lb 6 oz (82.7 kg)  11/14/21 179 lb (81.2 kg)          No data to display            ASSESSMENT AND PLAN:  1.  Palpitations: Likely PACs and PVCs.  Symptoms are well controlled with Toprol 12.5 mg twice daily.    2.  Essential hypertension: Blood pressure is well controlled on current  medications.  3.  Lower extremity cramps: Does not seem to be due to vascular disease.  Her distal pulses are normal.    Disposition:   FU in 12 months.  Signed,  Kathlyn Sacramento, MD  06/20/2022 2:51 PM    [facility]

## 2022-06-20 NOTE — Patient Instructions (Signed)
Medication Instructions:  Your physician recommends that you continue on your current medications as directed. Please refer to the Current Medication list given to you today.  *If you need a refill on your cardiac medications before your next appointment, please call your pharmacy*   Lab Work: None ordered If you have labs (blood work) drawn today and your tests are completely normal, you will receive your results only by: [place] (if you have MyChart) OR A paper copy in the mail If you have any lab test that is abnormal or we need to change your treatment, we will call you to review the results.   Follow-Up: At [facility], you and your health needs are our priority.  As part of our continuing mission to provide you with exceptional heart care, we have created designated Provider Care Teams.  These Care Teams include your primary Cardiologist (physician) and Advanced Practice Providers (APPs -  Physician Assistants and Nurse Practitioners) who all work together to provide you with the care you need, when you need it.  We recommend signing up for the patient portal called "MyChart".  Sign up information is provided on this After Visit Summary.  MyChart is used to connect with patients for Virtual Visits (Telemedicine).  Patients are able to view lab/test results, encounter notes, upcoming appointments, etc.  Non-urgent messages can be sent to your provider as well.   To learn more about what you can do with MyChart, go to [email or web address].    Your next appointment:   1 year(s)  The format for your next appointment:   In Person  Provider:   Kathlyn Sacramento, MD   Important Information About Sugar

## 2022-07-09 ENCOUNTER — Ambulatory Visit
Admission: EM | Admit: 2022-07-09 | Discharge: 2022-07-09 | Disposition: A | Discharge status: Home / Self Care | Payer: HMO | Attending: Internal Medicine | Admitting: Internal Medicine

## 2022-07-09 DIAGNOSIS — R141 Gas pain: Secondary | ICD-10-CM | POA: Insufficient documentation

## 2022-07-09 DIAGNOSIS — K5732 Diverticulitis of large intestine without perforation or abscess without bleeding: Secondary | ICD-10-CM | POA: Insufficient documentation

## 2022-07-09 DIAGNOSIS — N760 Acute vaginitis: Secondary | ICD-10-CM

## 2022-07-09 DIAGNOSIS — K600 Acute anal fissure: Secondary | ICD-10-CM | POA: Insufficient documentation

## 2022-07-09 DIAGNOSIS — Z1211 Encounter for screening for malignant neoplasm of colon: Secondary | ICD-10-CM | POA: Insufficient documentation

## 2022-07-09 DIAGNOSIS — K625 Hemorrhage of anus and rectum: Secondary | ICD-10-CM | POA: Insufficient documentation

## 2022-07-09 DIAGNOSIS — R109 Unspecified abdominal pain: Secondary | ICD-10-CM | POA: Insufficient documentation

## 2022-07-09 DIAGNOSIS — K219 Gastro-esophageal reflux disease without esophagitis: Secondary | ICD-10-CM | POA: Insufficient documentation

## 2022-07-09 DIAGNOSIS — K641 Second degree hemorrhoids: Secondary | ICD-10-CM

## 2022-07-09 DIAGNOSIS — K5730 Diverticulosis of large intestine without perforation or abscess without bleeding: Secondary | ICD-10-CM | POA: Insufficient documentation

## 2022-07-09 DIAGNOSIS — K5900 Constipation, unspecified: Secondary | ICD-10-CM | POA: Insufficient documentation

## 2022-07-09 DIAGNOSIS — K648 Other hemorrhoids: Secondary | ICD-10-CM | POA: Insufficient documentation

## 2022-07-09 DIAGNOSIS — R1032 Left lower quadrant pain: Secondary | ICD-10-CM | POA: Insufficient documentation

## 2022-07-09 MED ORDER — SENNOSIDES-DOCUSATE SODIUM 8.6-50 MG PO TABS: 1.0000 | ORAL_TABLET | Freq: Every day | ORAL | 0 refills | Status: AC

## 2022-07-09 MED ORDER — HYDROCORTISONE ACETATE 25 MG RE SUPP: 25.0000 mg | Freq: Two times a day (BID) | RECTAL | 0 refills | Status: AC

## 2022-07-09 MED ORDER — FLUCONAZOLE 150 MG PO TABS: 150.0000 mg | ORAL_TABLET | Freq: Once | ORAL | 0 refills | Status: AC

## 2022-07-09 NOTE — ED Triage Notes (Signed)
Pt. presents to UC w/ c/o vaginal itching for the past two weeks. Pt. States this is a recurrent issue and has been treating herself w/ a prescribed ointment cream that she already has from a prior diagnoses.

## 2022-07-09 NOTE — Discharge Instructions (Addendum)
Please increase vegetables and fiber intake Please take medications as prescribed Will call you with recommendations if labs are abnormal Please use vaginal wash for sensitive skin or mild soap Return to urgent care if you have worsening or persistent symptoms.

## 2022-07-10 LAB — CERVICOVAGINAL ANCILLARY ONLY
Bacterial Vaginitis (gardnerella): NEGATIVE
Candida Glabrata: POSITIVE — AB
Candida Vaginitis: POSITIVE — AB
Comment: NEGATIVE
Comment: NEGATIVE
Comment: NEGATIVE

## 2022-07-10 NOTE — ED Provider Notes (Signed)
[facility]    CSN: [id number] Arrival date & time: 07/09/22  7829      History   Chief Complaint Chief Complaint  Patient presents with   Vaginal Itching    HPI Nicole Garcia is a 76 y.o. female comes to the urgent care with 2-week history of vaginal itching.  Vaginal itching has been worsening over the past couple of weeks.  She denies any vaginal odor or vaginal discharge.  No dysuria urgency or frequency.  Patient uses Dove soap and bathing and has a vaginal wash that she uses every day.  No douching.  No abdominal pain or discomfort.  No vaginal bleeding.  Patient has a history of hemorrhoids.  She is currently constipated.  She has occasional bleeding from the hemorrhoids and has been using Preparation H with variable results.  She is constipated and has to strain a lot with every bowel movement.   HPI  Past Medical History:  Diagnosis Date   Arthritis    Atypical chest pain    a. 08/2014 Ex MV: EF 70%, no ischemia/infarct; b. 08/2017 ETT: Ex time 3:22. HTN response. No ST/T changes.   Concussion    Depression    Diabetes mellitus without complication ([place])    Diastolic dysfunction    a. 08/2017 Echo: EF 60%, no rwma, GrI DD, nl RV fxn.   Diverticulitis    GERD (gastroesophageal reflux disease)    History of chicken pox    Hyperglycemia    Hypertension    Physical deconditioning    a. 08/2017 ETT: Ex time 3:22.    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   B12 deficiency 06/08/2019   Tubular adenoma 06/08/2019   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[place]) 01/21/2018   DDD (degenerative disc disease), lumbar 03/11/2016   Vaginal lesion 05/08/2015   Paronychia, toe 04/06/2015   Renal insufficiency 04/06/2015   Diverticulitis 03/22/2015   Screening for breast cancer 03/22/2015   Obesity (BMI 30-39.9) 03/22/2015   Muscle spasm 01/24/2015   Diabetes mellitus, type 2 ([place]) 01/11/2015   Adjustment disorder with mixed anxiety and depressed mood 12/29/2007   Essential hypertension 12/29/2007   REFLUX ESOPHAGITIS 12/15/2007   IRRITABLE BOWEL SYNDROME 12/15/2007    Past Surgical History:  Procedure Laterality Date   ABDOMINAL HYSTERECTOMY     BLADDER SUSPENSION  1997   microinvasive Protogen   BREAST EXCISIONAL BIOPSY Left    BREAST EXCISIONAL BIOPSY Right    BREAST SURGERY     biopsy 1966 and 2000   CATARACT EXTRACTION, BILATERAL     CHOLECYSTECTOMY     ROOT CANAL     TOE SURGERY     VAGINAL DELIVERY     x2    OB History   No obstetric history on file.      Home Medications    Prior to Admission medications   Medication Sig Start Date  End Date Taking? Authorizing Provider  amoxicillin (AMOXIL) 500 MG capsule Take 500 mg by mouth every 8 (eight) hours. 05/16/22  Yes [provider]  chlorhexidine (PERIDEX) 0.12 % solution SMARTSIG:0.5 Capful(s) By Mouth Morning-Evening 03/28/22  Yes [provider]  clobetasol (OLUX) 0.05 % topical foam Apply topically 2 (two) times daily. 01/22/22  Yes [provider]  dicyclomine (BENTYL) 20 MG tablet Take by mouth. 01/21/22 01/21/23 Yes [provider]  furosemide (LASIX) 20 MG tablet Take by mouth. 07/04/22  Yes [provider]  hydrocortisone (ANUSOL-HC) 25 MG suppository Place 1 suppository (25 mg total) rectally 2 (two)  times daily. 07/09/22  Yes Gillian Meeuwsen, Myrene Galas, MD  penicillin v potassium (VEETID) 500 MG tablet Take 500 mg by mouth every 8 (eight) hours. 05/22/22  Yes [provider]  senna-docusate (SENOKOT-S) 8.6-50 MG tablet Take 1 tablet by mouth at bedtime. 07/09/22  Yes Glorianne Proctor, Myrene Galas, MD  sertraline (ZOLOFT) 25 MG tablet Take 1 tablet by mouth daily. 02/13/22 02/13/23 Yes [provider]  Acetaminophen (TYLENOL PO) Take by mouth. Am and pm as needed.    [provider]  ALPRAZolam Duanne Moron) 1 MG tablet Take 1 mg by mouth at bedtime as needed.  04/27/19   [provider]  amLODipine (NORVASC) 2.5 MG tablet Take 1 tablet (2.5 mg total) by mouth daily. 12/18/21   Wellington Hampshire, MD  cholecalciferol (VITAMIN D) 1000 UNITS tablet Take 1,000 Units by mouth daily.    [provider]  empagliflozin (JARDIANCE) 25 MG TABS tablet Take 25 mg by mouth daily.    [provider]  gabapentin (NEURONTIN) 100 MG capsule Take 100 mg by mouth at bedtime.    [provider]  glimepiride (AMARYL) 1 MG tablet Take 0.5 mg by mouth. Take 1 tab in the am and half tab in the pm    [provider]  JANUVIA 25 MG tablet Take 25 mg by mouth daily.    [provider]  metoprolol succinate (TOPROL-XL) 25 MG 24 hr tablet Take 1 tablet (25 mg total) by mouth 2 (two) times daily. Patient taking differently: Take 12.5 mg by mouth 2 (two) times daily. 12/18/21   Wellington Hampshire, MD  Multiple Vitamin (MULTIVITAMIN) capsule Take 1 capsule by mouth daily.    [provider]  Omega-3 Fatty Acids (FISH OIL PO) Take by mouth 2 (two) times daily at 10 AM and 5 PM.    [provider]  omeprazole (PRILOSEC) 20 MG capsule Take 20 mg by mouth daily.    [provider]  Probiotic Product (PROBIOTIC PO) Take 1 tablet by mouth daily.    [provider]    Family History Family History  Problem Relation Age of Onset   Diabetes Father     Cancer Sister 53       pancreatic   Cancer Brother 75       brain   Cancer Sister        lung   Diabetes Sister    Macular degeneration Sister    Kidney failure Sister    Tremor Sister    Healthy Son     Social History Social History   Tobacco Use   Smoking status: Never   Smokeless tobacco: Never  Vaping Use   Vaping Use: Never used  Substance Use Topics   Alcohol use: No    Alcohol/week: 0.0 standard drinks of alcohol   Drug use: No  Allergies   Iohexol, 5-alpha reductase inhibitors, Bactroban [mupirocin calcium], Ciprofloxacin, Codeine, Levofloxacin, Linaclotide, Metformin and related, Nitrofurantoin, and Sulfonamide derivatives   Review of Systems Review of Systems   Physical Exam Triage Vital Signs ED Triage Vitals [07/09/22 0936]  Enc Vitals Group     BP (!) 154/74     Pulse Rate 91     Resp 16     Temp 98.6 F (37 C)     Temp src      SpO2 99 %     Weight      Height      Head Circumference      Peak Flow      Pain Score 0     Pain Loc      Pain Edu?      Excl. in [place]?    No data found.  Updated Vital Signs BP (!) 154/74   Pulse 91   Temp 98.6 F (37 C)   Resp 16   SpO2 99%   Visual Acuity Right Eye Distance:   Left Eye Distance:   Bilateral Distance:    Right Eye Near:   Left Eye Near:    Bilateral Near:     Physical Exam Vitals and nursing note reviewed. Exam conducted with a chaperone present.  Cardiovascular:     Rate and Rhythm: Normal rate and regular rhythm.     Pulses: Normal pulses.     Heart sounds: Normal heart sounds.  Pulmonary:     Effort: Pulmonary effort is normal.     Breath sounds: Normal breath sounds.  Abdominal:     General: Bowel sounds are normal.     Palpations: Abdomen is soft.  Genitourinary:    Comments: Vaginal irritation mainly involving the labia minora.  No vaginal discharge noted.  No vaginal odor.  External hemorrhoids-nontender to palpation.  No bleeding noted.     UC Treatments /  Results  Labs (all labs ordered are listed, but only abnormal results are displayed) Labs Reviewed  CERVICOVAGINAL ANCILLARY ONLY    EKG   Radiology No results found.  Procedures Procedures (including critical care time)  Medications Ordered in UC Medications - No data to display  Initial Impression / Assessment and Plan / UC Course  I have reviewed the triage vital signs and the nursing notes.  Pertinent labs & imaging results that were available during my care of the patient were reviewed by me and considered in my medical decision making (see chart for details).     1.  Acute vaginitis: Cervicovaginal swab for BV/vaginal yeast Fluconazole 150 mg x 1 dose to be repeated if there is no improvement in symptoms Will call patient with recommendations if labs are abnormal Patient is advised to avoid using harsh vaginal washes  2.  Grade 2 hemorrhoids: Increase fiber/vegetable intake Senna nightly Anusol suppositories Return precautions given. Final Clinical Impressions(s) / UC Diagnoses   Final diagnoses:  Acute vaginitis  Grade II hemorrhoids     Discharge Instructions      Please increase vegetables and fiber intake Please take medications as prescribed Will call you with recommendations if labs are abnormal Please use vaginal wash for sensitive skin or mild soap Return to urgent care if you have worsening or persistent symptoms.   ED Prescriptions     Medication Sig Dispense Auth. Provider   fluconazole (DIFLUCAN) 150 MG tablet Take 1 tablet (150 mg total) by mouth once for 1 dose. Take second dose 3 days  after first dose if symptoms persist 2 tablet Dorien Mayotte, Myrene Galas, MD   hydrocortisone (ANUSOL-HC) 25 MG suppository Place 1 suppository (25 mg total) rectally 2 (two) times daily. 12 suppository Damon Baisch, Myrene Galas, MD   senna-docusate (SENOKOT-S) 8.6-50 MG tablet Take 1 tablet by mouth at bedtime. 30 tablet Jamelah Sitzer, Myrene Galas, MD      PDMP not reviewed  this encounter.   Chase Picket, MD 07/10/22 1357

## 2022-08-20 DIAGNOSIS — E538 Deficiency of other specified B group vitamins: Secondary | ICD-10-CM | POA: Diagnosis not present

## 2022-08-20 DIAGNOSIS — E1169 Type 2 diabetes mellitus with other specified complication: Secondary | ICD-10-CM | POA: Diagnosis not present

## 2022-08-20 DIAGNOSIS — Z0000 Encounter for general adult medical examination without abnormal findings: Secondary | ICD-10-CM | POA: Diagnosis not present

## 2022-08-20 DIAGNOSIS — F330 Major depressive disorder, recurrent, mild: Secondary | ICD-10-CM | POA: Diagnosis not present

## 2022-08-20 DIAGNOSIS — E782 Mixed hyperlipidemia: Secondary | ICD-10-CM | POA: Diagnosis not present

## 2022-08-28 DIAGNOSIS — N762 Acute vulvitis: Secondary | ICD-10-CM | POA: Diagnosis not present

## 2022-08-28 DIAGNOSIS — L292 Pruritus vulvae: Secondary | ICD-10-CM | POA: Diagnosis not present

## 2022-08-28 DIAGNOSIS — L900 Lichen sclerosus et atrophicus: Secondary | ICD-10-CM | POA: Diagnosis not present

## 2022-10-30 DIAGNOSIS — H00011 Hordeolum externum right upper eyelid: Secondary | ICD-10-CM | POA: Diagnosis not present

## 2022-11-28 DIAGNOSIS — E782 Mixed hyperlipidemia: Secondary | ICD-10-CM | POA: Diagnosis not present

## 2022-11-28 DIAGNOSIS — I10 Essential (primary) hypertension: Secondary | ICD-10-CM | POA: Diagnosis not present

## 2022-11-28 DIAGNOSIS — E1169 Type 2 diabetes mellitus with other specified complication: Secondary | ICD-10-CM | POA: Diagnosis not present

## 2022-12-24 ENCOUNTER — Other Ambulatory Visit: Payer: Self-pay | Admitting: Cardiovascular Disease

## 2023-01-19 DIAGNOSIS — Z882 Allergy status to sulfonamides status: Secondary | ICD-10-CM | POA: Diagnosis not present

## 2023-01-19 DIAGNOSIS — S4991XA Unspecified injury of right shoulder and upper arm, initial encounter: Secondary | ICD-10-CM | POA: Diagnosis not present

## 2023-01-19 DIAGNOSIS — S299XXA Unspecified injury of thorax, initial encounter: Secondary | ICD-10-CM | POA: Diagnosis not present

## 2023-01-19 DIAGNOSIS — M25562 Pain in left knee: Secondary | ICD-10-CM | POA: Diagnosis not present

## 2023-01-19 DIAGNOSIS — Z881 Allergy status to other antibiotic agents status: Secondary | ICD-10-CM | POA: Diagnosis not present

## 2023-01-19 DIAGNOSIS — S8992XA Unspecified injury of left lower leg, initial encounter: Secondary | ICD-10-CM | POA: Diagnosis not present

## 2023-01-19 DIAGNOSIS — M25561 Pain in right knee: Secondary | ICD-10-CM | POA: Diagnosis not present

## 2023-01-19 DIAGNOSIS — W19XXXA Unspecified fall, initial encounter: Secondary | ICD-10-CM | POA: Diagnosis not present

## 2023-01-19 DIAGNOSIS — Z9049 Acquired absence of other specified parts of digestive tract: Secondary | ICD-10-CM | POA: Diagnosis not present

## 2023-01-19 DIAGNOSIS — M25519 Pain in unspecified shoulder: Secondary | ICD-10-CM | POA: Diagnosis not present

## 2023-01-19 DIAGNOSIS — Z885 Allergy status to narcotic agent status: Secondary | ICD-10-CM | POA: Diagnosis not present

## 2023-01-19 DIAGNOSIS — S199XXA Unspecified injury of neck, initial encounter: Secondary | ICD-10-CM | POA: Diagnosis not present

## 2023-01-19 DIAGNOSIS — I10 Essential (primary) hypertension: Secondary | ICD-10-CM | POA: Diagnosis not present

## 2023-01-19 DIAGNOSIS — Z888 Allergy status to other drugs, medicaments and biological substances status: Secondary | ICD-10-CM | POA: Diagnosis not present

## 2023-01-19 DIAGNOSIS — S0990XA Unspecified injury of head, initial encounter: Secondary | ICD-10-CM | POA: Diagnosis not present

## 2023-01-19 DIAGNOSIS — Z79899 Other long term (current) drug therapy: Secondary | ICD-10-CM | POA: Diagnosis not present

## 2023-01-19 DIAGNOSIS — R0689 Other abnormalities of breathing: Secondary | ICD-10-CM | POA: Diagnosis not present

## 2023-01-19 DIAGNOSIS — T07XXXA Unspecified multiple injuries, initial encounter: Secondary | ICD-10-CM | POA: Diagnosis not present

## 2023-01-23 DIAGNOSIS — M25561 Pain in right knee: Secondary | ICD-10-CM | POA: Diagnosis not present

## 2023-01-23 DIAGNOSIS — M25562 Pain in left knee: Secondary | ICD-10-CM | POA: Diagnosis not present

## 2023-01-23 DIAGNOSIS — M7551 Bursitis of right shoulder: Secondary | ICD-10-CM | POA: Diagnosis not present

## 2023-01-23 DIAGNOSIS — Z9181 History of falling: Secondary | ICD-10-CM | POA: Diagnosis not present

## 2023-01-24 DIAGNOSIS — D2371 Other benign neoplasm of skin of right lower limb, including hip: Secondary | ICD-10-CM | POA: Diagnosis not present

## 2023-01-24 DIAGNOSIS — L821 Other seborrheic keratosis: Secondary | ICD-10-CM | POA: Diagnosis not present

## 2023-01-24 DIAGNOSIS — L400 Psoriasis vulgaris: Secondary | ICD-10-CM | POA: Diagnosis not present

## 2023-01-24 DIAGNOSIS — B372 Candidiasis of skin and nail: Secondary | ICD-10-CM | POA: Diagnosis not present

## 2023-02-07 DIAGNOSIS — E119 Type 2 diabetes mellitus without complications: Secondary | ICD-10-CM | POA: Diagnosis not present

## 2023-02-07 DIAGNOSIS — H26493 Other secondary cataract, bilateral: Secondary | ICD-10-CM | POA: Diagnosis not present

## 2023-02-07 DIAGNOSIS — H52203 Unspecified astigmatism, bilateral: Secondary | ICD-10-CM | POA: Diagnosis not present

## 2023-02-07 DIAGNOSIS — Z961 Presence of intraocular lens: Secondary | ICD-10-CM | POA: Diagnosis not present

## 2023-02-13 DIAGNOSIS — H903 Sensorineural hearing loss, bilateral: Secondary | ICD-10-CM | POA: Diagnosis not present

## 2023-02-17 DIAGNOSIS — E538 Deficiency of other specified B group vitamins: Secondary | ICD-10-CM | POA: Diagnosis not present

## 2023-02-17 DIAGNOSIS — E782 Mixed hyperlipidemia: Secondary | ICD-10-CM | POA: Diagnosis not present

## 2023-02-17 DIAGNOSIS — E1169 Type 2 diabetes mellitus with other specified complication: Secondary | ICD-10-CM | POA: Diagnosis not present

## 2023-02-19 DIAGNOSIS — H26491 Other secondary cataract, right eye: Secondary | ICD-10-CM | POA: Diagnosis not present

## 2023-02-24 ENCOUNTER — Ambulatory Visit
Admission: RE | Admit: 2023-02-24 | Discharge: 2023-02-24 | Disposition: A | Discharge status: Home / Self Care | Payer: PPO | Source: Ambulatory Visit | Attending: Internal Medicine | Admitting: Internal Medicine

## 2023-02-24 ENCOUNTER — Encounter: Payer: Self-pay | Admitting: Internal Medicine

## 2023-02-24 ENCOUNTER — Other Ambulatory Visit: Payer: Self-pay | Admitting: Internal Medicine

## 2023-02-24 DIAGNOSIS — E782 Mixed hyperlipidemia: Secondary | ICD-10-CM | POA: Insufficient documentation

## 2023-02-24 DIAGNOSIS — F0781 Postconcussional syndrome: Secondary | ICD-10-CM

## 2023-02-24 DIAGNOSIS — E538 Deficiency of other specified B group vitamins: Secondary | ICD-10-CM | POA: Diagnosis not present

## 2023-02-24 DIAGNOSIS — N644 Mastodynia: Secondary | ICD-10-CM | POA: Diagnosis not present

## 2023-02-24 DIAGNOSIS — Z1231 Encounter for screening mammogram for malignant neoplasm of breast: Secondary | ICD-10-CM

## 2023-02-24 DIAGNOSIS — E1169 Type 2 diabetes mellitus with other specified complication: Secondary | ICD-10-CM

## 2023-02-24 DIAGNOSIS — M25562 Pain in left knee: Secondary | ICD-10-CM | POA: Diagnosis not present

## 2023-02-24 DIAGNOSIS — T466X5A Adverse effect of antihyperlipidemic and antiarteriosclerotic drugs, initial encounter: Secondary | ICD-10-CM | POA: Diagnosis not present

## 2023-02-24 DIAGNOSIS — R519 Headache, unspecified: Secondary | ICD-10-CM | POA: Diagnosis not present

## 2023-02-24 DIAGNOSIS — K719 Toxic liver disease, unspecified: Secondary | ICD-10-CM | POA: Diagnosis not present

## 2023-02-27 ENCOUNTER — Other Ambulatory Visit: Payer: Self-pay | Admitting: Internal Medicine

## 2023-02-27 DIAGNOSIS — N644 Mastodynia: Secondary | ICD-10-CM

## 2023-03-18 DIAGNOSIS — K5730 Diverticulosis of large intestine without perforation or abscess without bleeding: Secondary | ICD-10-CM | POA: Diagnosis not present

## 2023-03-18 DIAGNOSIS — Z86010 Personal history of colonic polyps: Secondary | ICD-10-CM | POA: Diagnosis not present

## 2023-03-18 DIAGNOSIS — H5711 Ocular pain, right eye: Secondary | ICD-10-CM | POA: Diagnosis not present

## 2023-03-18 DIAGNOSIS — K5904 Chronic idiopathic constipation: Secondary | ICD-10-CM | POA: Diagnosis not present

## 2023-03-18 DIAGNOSIS — R140 Abdominal distension (gaseous): Secondary | ICD-10-CM | POA: Diagnosis not present

## 2023-03-18 DIAGNOSIS — K625 Hemorrhage of anus and rectum: Secondary | ICD-10-CM | POA: Diagnosis not present

## 2023-03-18 DIAGNOSIS — K219 Gastro-esophageal reflux disease without esophagitis: Secondary | ICD-10-CM | POA: Diagnosis not present

## 2023-03-18 DIAGNOSIS — K449 Diaphragmatic hernia without obstruction or gangrene: Secondary | ICD-10-CM | POA: Diagnosis not present

## 2023-03-25 DIAGNOSIS — K5904 Chronic idiopathic constipation: Secondary | ICD-10-CM | POA: Diagnosis not present

## 2023-03-25 DIAGNOSIS — K641 Second degree hemorrhoids: Secondary | ICD-10-CM | POA: Diagnosis not present

## 2023-03-25 DIAGNOSIS — K5730 Diverticulosis of large intestine without perforation or abscess without bleeding: Secondary | ICD-10-CM | POA: Diagnosis not present

## 2023-03-25 DIAGNOSIS — K602 Anal fissure, unspecified: Secondary | ICD-10-CM | POA: Diagnosis not present

## 2023-03-25 DIAGNOSIS — K625 Hemorrhage of anus and rectum: Secondary | ICD-10-CM | POA: Diagnosis not present

## 2023-04-10 DIAGNOSIS — K922 Gastrointestinal hemorrhage, unspecified: Secondary | ICD-10-CM | POA: Diagnosis not present

## 2023-04-10 DIAGNOSIS — M7062 Trochanteric bursitis, left hip: Secondary | ICD-10-CM | POA: Diagnosis not present

## 2023-04-10 DIAGNOSIS — K5792 Diverticulitis of intestine, part unspecified, without perforation or abscess without bleeding: Secondary | ICD-10-CM | POA: Diagnosis not present

## 2023-04-14 ENCOUNTER — Ambulatory Visit: Admission: RE | Admit: 2023-04-14 | Payer: PPO | Source: Ambulatory Visit

## 2023-04-14 ENCOUNTER — Other Ambulatory Visit: Payer: Self-pay | Admitting: Internal Medicine

## 2023-04-14 ENCOUNTER — Ambulatory Visit
Admission: RE | Admit: 2023-04-14 | Discharge: 2023-04-14 | Disposition: A | Discharge status: Home / Self Care | Payer: PPO | Source: Ambulatory Visit | Attending: Internal Medicine | Admitting: Internal Medicine

## 2023-04-14 DIAGNOSIS — K5792 Diverticulitis of intestine, part unspecified, without perforation or abscess without bleeding: Secondary | ICD-10-CM | POA: Diagnosis not present

## 2023-04-14 DIAGNOSIS — N644 Mastodynia: Secondary | ICD-10-CM

## 2023-04-14 DIAGNOSIS — M5116 Intervertebral disc disorders with radiculopathy, lumbar region: Secondary | ICD-10-CM | POA: Diagnosis not present

## 2023-04-14 DIAGNOSIS — K922 Gastrointestinal hemorrhage, unspecified: Secondary | ICD-10-CM | POA: Diagnosis not present

## 2023-04-14 DIAGNOSIS — R1084 Generalized abdominal pain: Secondary | ICD-10-CM

## 2023-04-14 DIAGNOSIS — M25551 Pain in right hip: Secondary | ICD-10-CM | POA: Diagnosis not present

## 2023-04-14 DIAGNOSIS — M25552 Pain in left hip: Secondary | ICD-10-CM | POA: Diagnosis not present

## 2023-04-15 ENCOUNTER — Ambulatory Visit
Admission: RE | Admit: 2023-04-15 | Discharge: 2023-04-15 | Disposition: A | Discharge status: Home / Self Care | Payer: PPO | Source: Ambulatory Visit | Attending: Internal Medicine | Admitting: Internal Medicine

## 2023-04-15 DIAGNOSIS — K5792 Diverticulitis of intestine, part unspecified, without perforation or abscess without bleeding: Secondary | ICD-10-CM | POA: Diagnosis not present

## 2023-04-15 DIAGNOSIS — R1084 Generalized abdominal pain: Secondary | ICD-10-CM | POA: Insufficient documentation

## 2023-04-15 DIAGNOSIS — K5730 Diverticulosis of large intestine without perforation or abscess without bleeding: Secondary | ICD-10-CM | POA: Diagnosis not present

## 2023-04-15 DIAGNOSIS — K922 Gastrointestinal hemorrhage, unspecified: Secondary | ICD-10-CM | POA: Diagnosis not present

## 2023-04-15 DIAGNOSIS — R1010 Upper abdominal pain, unspecified: Secondary | ICD-10-CM | POA: Diagnosis not present

## 2023-04-18 DIAGNOSIS — K5730 Diverticulosis of large intestine without perforation or abscess without bleeding: Secondary | ICD-10-CM | POA: Diagnosis not present

## 2023-04-18 DIAGNOSIS — K219 Gastro-esophageal reflux disease without esophagitis: Secondary | ICD-10-CM | POA: Diagnosis not present

## 2023-04-18 DIAGNOSIS — Z86010 Personal history of colonic polyps: Secondary | ICD-10-CM | POA: Diagnosis not present

## 2023-04-18 DIAGNOSIS — K449 Diaphragmatic hernia without obstruction or gangrene: Secondary | ICD-10-CM | POA: Diagnosis not present

## 2023-04-18 DIAGNOSIS — K5900 Constipation, unspecified: Secondary | ICD-10-CM | POA: Diagnosis not present

## 2023-05-06 DIAGNOSIS — M7060 Trochanteric bursitis, unspecified hip: Secondary | ICD-10-CM | POA: Diagnosis not present

## 2023-05-09 DIAGNOSIS — M7060 Trochanteric bursitis, unspecified hip: Secondary | ICD-10-CM | POA: Diagnosis not present

## 2023-05-13 DIAGNOSIS — R252 Cramp and spasm: Secondary | ICD-10-CM | POA: Diagnosis not present

## 2023-05-13 DIAGNOSIS — M7060 Trochanteric bursitis, unspecified hip: Secondary | ICD-10-CM | POA: Diagnosis not present

## 2023-05-14 DIAGNOSIS — L821 Other seborrheic keratosis: Secondary | ICD-10-CM | POA: Diagnosis not present

## 2023-05-14 DIAGNOSIS — K130 Diseases of lips: Secondary | ICD-10-CM | POA: Diagnosis not present

## 2023-05-28 ENCOUNTER — Other Ambulatory Visit: Payer: Self-pay | Admitting: Internal Medicine

## 2023-05-28 DIAGNOSIS — Z23 Encounter for immunization: Secondary | ICD-10-CM | POA: Diagnosis not present

## 2023-05-28 DIAGNOSIS — M5416 Radiculopathy, lumbar region: Secondary | ICD-10-CM

## 2023-05-28 DIAGNOSIS — M4187 Other forms of scoliosis, lumbosacral region: Secondary | ICD-10-CM | POA: Diagnosis not present

## 2023-05-28 DIAGNOSIS — G609 Hereditary and idiopathic neuropathy, unspecified: Secondary | ICD-10-CM | POA: Diagnosis not present

## 2023-05-28 DIAGNOSIS — R252 Cramp and spasm: Secondary | ICD-10-CM | POA: Diagnosis not present

## 2023-05-28 DIAGNOSIS — M5116 Intervertebral disc disorders with radiculopathy, lumbar region: Secondary | ICD-10-CM | POA: Diagnosis not present

## 2023-06-02 ENCOUNTER — Ambulatory Visit
Admission: RE | Admit: 2023-06-02 | Discharge: 2023-06-02 | Disposition: A | Discharge status: Home / Self Care | Payer: PPO | Source: Ambulatory Visit | Attending: Internal Medicine | Admitting: Internal Medicine

## 2023-06-02 DIAGNOSIS — M4807 Spinal stenosis, lumbosacral region: Secondary | ICD-10-CM | POA: Diagnosis not present

## 2023-06-02 DIAGNOSIS — M48061 Spinal stenosis, lumbar region without neurogenic claudication: Secondary | ICD-10-CM | POA: Diagnosis not present

## 2023-06-02 DIAGNOSIS — M5416 Radiculopathy, lumbar region: Secondary | ICD-10-CM | POA: Insufficient documentation

## 2023-06-02 DIAGNOSIS — M47816 Spondylosis without myelopathy or radiculopathy, lumbar region: Secondary | ICD-10-CM | POA: Diagnosis not present

## 2023-06-02 DIAGNOSIS — M5136 Other intervertebral disc degeneration, lumbar region with discogenic back pain only: Secondary | ICD-10-CM | POA: Diagnosis not present

## 2023-06-17 DIAGNOSIS — E1169 Type 2 diabetes mellitus with other specified complication: Secondary | ICD-10-CM | POA: Diagnosis not present

## 2023-06-17 DIAGNOSIS — E782 Mixed hyperlipidemia: Secondary | ICD-10-CM | POA: Diagnosis not present

## 2023-06-17 DIAGNOSIS — M5136 Other intervertebral disc degeneration, lumbar region with discogenic back pain only: Secondary | ICD-10-CM | POA: Diagnosis not present

## 2023-07-02 ENCOUNTER — Other Ambulatory Visit: Payer: Self-pay | Admitting: Cardiovascular Disease

## 2023-07-02 NOTE — Telephone Encounter (Signed)
Good Morning,  Could you schedule this patient a yearly follow up? The patient was last seen by Dr. Kirke Corin on 06-20-2022. Thank you so much.

## 2023-07-04 NOTE — Telephone Encounter (Signed)
Last office visit 06/20/22 with plan to f/u in 12 months  next office visit:  none/active recall

## 2023-07-14 NOTE — Telephone Encounter (Signed)
Left voice mail to schedule

## 2023-08-17 ENCOUNTER — Emergency Department: Payer: PPO

## 2023-08-17 ENCOUNTER — Telehealth: Payer: Self-pay | Admitting: Physician Assistant

## 2023-08-17 DIAGNOSIS — I10 Essential (primary) hypertension: Secondary | ICD-10-CM | POA: Diagnosis not present

## 2023-08-17 DIAGNOSIS — R079 Chest pain, unspecified: Secondary | ICD-10-CM | POA: Diagnosis not present

## 2023-08-17 DIAGNOSIS — R002 Palpitations: Secondary | ICD-10-CM | POA: Insufficient documentation

## 2023-08-17 DIAGNOSIS — R0789 Other chest pain: Secondary | ICD-10-CM | POA: Insufficient documentation

## 2023-08-17 LAB — CBC WITH DIFFERENTIAL/PLATELET
Abs Immature Granulocytes: 0.03 10*3/uL (ref 0.00–0.07)
Basophils Absolute: 0.1 10*3/uL (ref 0.0–0.1)
Basophils Relative: 1 %
Eosinophils Absolute: 0.1 10*3/uL (ref 0.0–0.5)
Eosinophils Relative: 1 %
HCT: 44.9 % (ref 36.0–46.0)
Hemoglobin: 14.6 g/dL (ref 12.0–15.0)
Immature Granulocytes: 0 %
Lymphocytes Relative: 28 %
Lymphs Abs: 2.8 10*3/uL (ref 0.7–4.0)
MCH: 28.9 pg (ref 26.0–34.0)
MCHC: 32.5 g/dL (ref 30.0–36.0)
MCV: 88.9 fL (ref 80.0–100.0)
Monocytes Absolute: 0.6 10*3/uL (ref 0.1–1.0)
Monocytes Relative: 6 %
Neutro Abs: 6.4 10*3/uL (ref 1.7–7.7)
Neutrophils Relative %: 64 %
Platelets: 393 10*3/uL (ref 150–400)
RBC: 5.05 MIL/uL (ref 3.87–5.11)
RDW: 13.4 % (ref 11.5–15.5)
WBC: 10 10*3/uL (ref 4.0–10.5)
nRBC: 0 % (ref 0.0–0.2)

## 2023-08-17 LAB — BASIC METABOLIC PANEL
Anion gap: 11 (ref 5–15)
BUN: 8 mg/dL (ref 8–23)
CO2: 26 mmol/L (ref 22–32)
Calcium: 9.6 mg/dL (ref 8.9–10.3)
Chloride: 102 mmol/L (ref 98–111)
Creatinine, Ser: 0.83 mg/dL (ref 0.44–1.00)
GFR, Estimated: >60 mL/min (ref >60)
Glucose, Bld: 184 mg/dL — ABNORMAL HIGH (ref 70–99)
Potassium: 4.1 mmol/L (ref 3.5–5.1)
Sodium: 139 mmol/L (ref 135–145)

## 2023-08-17 LAB — TROPONIN I (HIGH SENSITIVITY): Troponin I (High Sensitivity): 3 ng/L (ref <18)

## 2023-08-17 NOTE — Telephone Encounter (Signed)
Patient of Dr. Hilary Hertz who has a history of palpitation and hypertension.  Previously seen in the past for atypical chest pain.  Myoview in January 2016 showed no ischemia, normal EF.  Patient contacted after-hours answering service and mentioned that she has been having some intermittent left arm pain for several days now.  She was taking down the Christmas decorations today when she started having recurrent left arm pain and also chest pain.  Her blood pressure is elevated, heart rate went up to 120 before coming back down.  I recommended she seek urgent medical attention at local ED by calling EMS.

## 2023-08-17 NOTE — ED Provider Triage Note (Signed)
Emergency Medicine Provider Triage Evaluation Note  Nicole Garcia , a 77 y.o. female  was evaluated in triage.  Pt complains of chest pain that radiates into her left arm, that began 3 days ago. Started in her arm 3 days ago, was in her jaw a couple days ago and today she has chest pain.  Review of Systems  Positive: CP, SOB Negative:   Physical Exam  There were no vitals taken for this visit. Gen:   Awake, no distress   Resp:  Normal effort  MSK:   Moves extremities without difficulty  Other:    Medical Decision Making  Medically screening exam initiated at 5:11 PM.  Appropriate orders placed.  Garth Bigness was informed that the remainder of the evaluation will be completed by another provider, this initial triage assessment does not replace that evaluation, and the importance of remaining in the ED until their evaluation is complete.    Cameron Ali, PA-C 08/17/23 1712

## 2023-08-17 NOTE — ED Triage Notes (Signed)
C/O left sided chest pains today.  Occurred while taking christmas decorations down.  HR 120 S and blood pressure elevated.  Reports CBG elevated as well. Also pain shooting down right arm.  States pain has been ongoing (to arm) for 3 days.

## 2023-08-18 ENCOUNTER — Emergency Department
Admission: EM | Admit: 2023-08-18 | Discharge: 2023-08-18 | Disposition: A | Discharge status: Home / Self Care | Payer: PPO | Attending: Emergency Medicine | Admitting: Emergency Medicine

## 2023-08-18 ENCOUNTER — Other Ambulatory Visit: Payer: Self-pay

## 2023-08-18 ENCOUNTER — Encounter: Payer: Self-pay | Admitting: Medical

## 2023-08-18 ENCOUNTER — Telehealth: Payer: Self-pay | Admitting: Cardiovascular Disease

## 2023-08-18 ENCOUNTER — Ambulatory Visit: Payer: PPO | Attending: Medical | Admitting: Medical

## 2023-08-18 ENCOUNTER — Encounter: Payer: Self-pay | Admitting: Emergency Medicine

## 2023-08-18 VITALS — BP 162/81 | HR 78 | Ht <= 58 in | Wt 177.6 lb

## 2023-08-18 DIAGNOSIS — E782 Mixed hyperlipidemia: Secondary | ICD-10-CM | POA: Diagnosis not present

## 2023-08-18 DIAGNOSIS — R002 Palpitations: Secondary | ICD-10-CM

## 2023-08-18 DIAGNOSIS — I10 Essential (primary) hypertension: Secondary | ICD-10-CM

## 2023-08-18 DIAGNOSIS — R079 Chest pain, unspecified: Secondary | ICD-10-CM

## 2023-08-18 DIAGNOSIS — E1169 Type 2 diabetes mellitus with other specified complication: Secondary | ICD-10-CM | POA: Diagnosis not present

## 2023-08-18 LAB — TROPONIN I (HIGH SENSITIVITY): Troponin I (High Sensitivity): 4 ng/L (ref <18)

## 2023-08-18 MED ORDER — ACETAMINOPHEN 325 MG PO TABS
650.0000 mg | ORAL_TABLET | Freq: Once | ORAL | Status: AC
  Administered 2023-08-18: 650 mg via ORAL
  Filled 2023-08-18: qty 2

## 2023-08-18 MED ORDER — AMLODIPINE BESYLATE 2.5 MG PO TABS: 2.5000 mg | ORAL_TABLET | Freq: Every day | ORAL | 3 refills | Status: AC

## 2023-08-18 MED ORDER — METOPROLOL SUCCINATE ER 50 MG PO TB24: 50.0000 mg | ORAL_TABLET | Freq: Every day | ORAL | 3 refills | Status: DC

## 2023-08-18 MED ORDER — METOPROLOL TARTRATE 25 MG PO TABS
12.5000 mg | ORAL_TABLET | Freq: Once | ORAL | Status: AC
  Administered 2023-08-18: 12.5 mg via ORAL
  Filled 2023-08-18: qty 1

## 2023-08-18 NOTE — Telephone Encounter (Signed)
Spoke with patient and she reports going to ED yesterday at 5:30 pm and not being seen until today at 7:00 am. She was told to follow up with cardiology as soon as possible. She wants to see Dr. Kirke Corin but his next available is out to April and she has appointment already in March. Offered her appointment today to be seen here by one of our APP's and she was agreeable. Scheduled her for today and she confirmed time.

## 2023-08-18 NOTE — Progress Notes (Signed)
Cardiology Office Note:    Date:  08/18/2023   ID:  Auzhane, Slutsky 1946/06/08, MRN [id number]  PCP:  Danella Penton, MD  [facility] Cardiologist:  Lorine Bears, MD  [facility] Electrophysiologist:  None   Referring MD: Danella Penton, MD   Chief Complaint: ER follow-up  History of Present Illness:    Nicole Garcia is a 77 y.o. female with a hx of palpitations and hypertension, diabetes type 2, GERD who presents for follow-up.  Patient was seen in 2016 for atypical chest pain and shortness of breath.  A treadmill nuclear stress test at that time showed no evidence of ischemia with normal ejection fraction.  Echo was overall unremarkable except for mild diastolic dysfunction.  Patient takes amlodipine and Toprol for elevated blood pressure.  Beta-blocker has been increased due to palpitations.  Heart monitor showed sinus rhythm with rare PACs and PVCs.  Patient was last seen in November 2023 and was doing well with no recurrent palpitations.  The patient went to the ER 08/18/23 for chest pain. BP was high. She ruled out for ACS.  Today, she reported chest pain and arm pain. Left arm pain started Thursday. She was taking down [employer]. She had chest pain and back pain and felt SOB. Also noted headache and nausea. Chest pain across the chest. BP was high and BG was high.   She denies no active chest pain, some soreness. Have not had chest pain before. Not worse with exertion. No recent fever, chills, nausea, vomiting. No lower leg edema. She has allergy to dye, says she is cery sensitive to medications.  Past Medical History:  Diagnosis Date   Arthritis    Atypical chest pain    a. 08/2014 Ex MV: EF 70%, no ischemia/infarct; b. 08/2017 ETT: Ex time 3:22. HTN response. No ST/T changes.   Concussion    Depression    Diabetes mellitus without complication (HCC)    Diastolic dysfunction    a. 08/2017 Echo: EF 60%, no rwma, GrI DD, nl RV fxn.    Diverticulitis    GERD (gastroesophageal reflux disease)    History of chicken pox    Hyperglycemia    Hypertension    Physical deconditioning    a. 08/2017 ETT: Ex time 3:22.    Past Surgical History:  Procedure Laterality Date   ABDOMINAL HYSTERECTOMY     BLADDER SUSPENSION  1997   microinvasive Protogen   BREAST EXCISIONAL BIOPSY Left    BREAST EXCISIONAL BIOPSY Right    BREAST SURGERY     biopsy 1966 and 2000   CATARACT EXTRACTION, BILATERAL     CHOLECYSTECTOMY     ROOT CANAL     TOE SURGERY     VAGINAL DELIVERY     x2    Current Medications: Current Meds  Medication Sig   Acetaminophen (TYLENOL PO) Take by mouth. Am and pm as needed.   ALPRAZolam (XANAX) 1 MG tablet Take 1 mg by mouth at bedtime as needed.    furosemide (LASIX) 20 MG tablet Take by mouth.   glimepiride (AMARYL) 1 MG tablet Take 1 mg by mouth. 3 times per day   hydrocortisone (ANUSOL-HC) 25 MG suppository Place 1 suppository (25 mg total) rectally 2 (two) times daily.   omeprazole (PRILOSEC) 20 MG capsule Take 20 mg by mouth daily.   Probiotic Product (PROBIOTIC PO) Take 1 tablet by mouth daily.   senna-docusate (SENOKOT-S) 8.6-50 MG tablet Take 1 tablet by mouth  at bedtime.   [DISCONTINUED] metoprolol succinate (TOPROL-XL) 25 MG 24 hr tablet Take 0.5 tablets (12.5 mg total) by mouth 2 (two) times daily. Due yearly visit.  PLEASE CALL OFFICE TO SCHEDULE APPOINTMENT PRIOR TO NEXT REFILL (first attempt) (Patient taking differently: Take 25 mg by mouth daily. Due yearly visit.  PLEASE CALL OFFICE TO SCHEDULE APPOINTMENT PRIOR TO NEXT REFILL (first attempt))     Allergies:   Iohexol, 5-alpha reductase inhibitors, Bactroban [mupirocin calcium], Ciprofloxacin, Codeine, Levofloxacin, Linaclotide, Metformin and related, Nitrofurantoin, and Sulfonamide derivatives   Social History   Socioeconomic History   Marital status: Widowed    Spouse name: Not on file   Number of children: 2   Years of education: Not  on file   Highest education level: High school graduate  Occupational History   Not on file  Tobacco Use   Smoking status: Never   Smokeless tobacco: Never  Vaping Use   Vaping status: Never Used  Substance and Sexual Activity   Alcohol use: No    Alcohol/week: 0.0 standard drinks of alcohol   Drug use: No   Sexual activity: Not Currently  Other Topics Concern   Not on file  Social History Narrative   Lives in [place].  Husband passed in July 2022 in the setting of GI bleeding and she has been very anxious and concerned about her health ever since.  Does not like living alone.  Has dog in home. Son lives in [place] and [place].      Work - [employer], owned 25years.      Social Drivers of Corporate investment banker Strain: Low Risk  (04/10/2023)   Received from [facility]   Overall Financial Resource Strain (CARDIA)    Difficulty of Paying Living Expenses: Not hard at all  Food Insecurity: No Food Insecurity (04/10/2023)   Received from [facility]   Hunger Vital Sign    Worried About Running Out of Food in the Last Year: Never true    Ran Out of Food in the Last Year: Never true  Transportation Needs: No Transportation Needs (04/10/2023)   Received from [facility] - Transportation    In the past 12 months, has lack of transportation kept you from medical appointments or from getting medications?: No    Lack of Transportation (Non-Medical): No  Physical Activity: Not on file  Stress: Not on file  Social Connections: Unknown (01/19/2023)   Received from [facility]   Social Network    Social Network: Not on file     Family History: The patient's family history includes Cancer in her sister; Cancer (age of onset: 64) in her brother; Cancer (age of onset: 10) in her sister; Diabetes in her father and sister; Healthy in her son; Kidney failure in her sister; Macular degeneration in her sister;  Tremor in her sister.  ROS:   Please see the history of present illness.     All other systems reviewed and are negative.  EKGs/Labs/Other Studies Reviewed:    The following studies were reviewed today:  Heart monitor 11/2021 Patch Wear Time:  6 days and 23 hours (2023-03-31T17:36:17-0400 to 2023-04-07T17:06:08-398)   Patient had a min HR of 61 bpm, max HR of 128 bpm, and avg HR of 88 bpm. Predominant underlying rhythm was Sinus Rhythm. Rare PACs and rare PVCs.  No significant arrhythmia overall.  ETT 08/2017 Blood pressure demonstrated a hypertensive response to  exercise. There was no ST segment deviation noted during stress. No T wave inversion was noted during stress.   Normal treadmill stress test with no evidence of ischemia. Poor exercise capacity with hypertensive response to exercise. Exercise duration was 3 minutes and 22 seconds correlating with 5 Mets. Peak blood pressure was 191/84  Echo 08/2017 Study Conclusions   - Left ventricle: The cavity size was normal. Systolic function was    normal. The estimated ejection fraction was in the range of 60%    to 65%. Wall motion was normal; there were no regional wall    motion abnormalities. Doppler parameters are consistent with    abnormal left ventricular relaxation (grade 1 diastolic    dysfunction).  - Left atrium: The atrium was normal in size.  - Right ventricle: Systolic function was normal.  - Pulmonary arteries: Systolic pressure was within the normal    range.   EKG:  EKG is ordered today.  The ekg ordered today demonstrates NSR 78bpm, no ST/t wave changes  Recent Labs: 08/17/2023: BUN 8; Creatinine, Ser 0.83; Hemoglobin 14.6; Platelets 393; Potassium 4.1; Sodium 139  Recent Lipid Panel    Component Value Date/Time   CHOL 207 (H) 03/31/2015 1055   TRIG 383.0 (H) 03/31/2015 1055   HDL 37.70 (L) 03/31/2015 1055   CHOLHDL 5 03/31/2015 1055   VLDL 76.6 (H) 03/31/2015 1055   LDLDIRECT 101.0 03/31/2015 1055     Physical Exam:    VS:  BP (!) 162/81 (BP Location: Left Arm, Patient Position: Sitting, Cuff Size: Normal)   Pulse 78   Ht 4\' 10"  (1.473 m)   Wt 177 lb 9.6 oz (80.6 kg)   SpO2 98%   BMI 37.12 kg/m     Wt Readings from Last 3 Encounters:  08/18/23 177 lb 9.6 oz (80.6 kg)  06/20/22 181 lb 6 oz (82.3 kg)  12/18/21 182 lb 6 oz (82.7 kg)     GEN:  Well nourished, well developed in no acute distress HEENT: Normal NECK: No JVD; No carotid bruits LYMPHATICS: No lymphadenopathy CARDIAC: RRR, no murmurs, rubs, gallops RESPIRATORY:  Clear to auscultation without rales, wheezing or rhonchi  ABDOMEN: Soft, non-tender, non-distended MUSCULOSKELETAL:  No edema; No deformity  SKIN: Warm and dry NEUROLOGIC:  Alert and oriented x 3 PSYCHIATRIC:  Normal affect   ASSESSMENT:    1. Palpitations   2. Chest pain, unspecified type    PLAN:    In order of problems listed above:  Chest pain HTN Recent ER visit (this morning) for chest pain and left arm pain. HS trop negative x 2. BP found to be elevated. EKG today shows NSR and nonspecific changes. No active chest pain. BP still mildly elevated. I will increase Toprol 25mg  daily and restart amlodipine 2.5mg  daily. Patient previously self-discontinued amlodipine. She reports dye allergy with lots of medication sensitivities. I will order a Myoview ETT.  Palpitations She denies palpitations. Heart monitor in 2023 showed NST with rare PACs and PVCs. Continue BB therapy.  DM2 A1C 7.5. This is followed by PCP.  Disposition: Follow up in 1 month(s) with MD/APP   Shared Decision Making/Informed Consent   Informed Consent   Shared Decision Making/Informed Consent The risks [chest pain, shortness of breath, cardiac arrhythmias, dizziness, blood pressure fluctuations, myocardial infarction, stroke/transient ischemic attack, nausea, vomiting, allergic reaction, radiation exposure, metallic taste sensation and life-threatening complications  (estimated to be 1 in 10,000)], benefits (risk stratification, diagnosing coronary artery disease, treatment guidance) and alternatives of a  nuclear stress test were discussed in detail with Nicole Garcia and she agrees to proceed.       Signed, Daysha Ashmore David Stall, PA-C  08/18/2023 3:30 PM    [facility]

## 2023-08-18 NOTE — ED Provider Notes (Signed)
[facility] Provider Note   Event Date/Time   First MD Initiated Contact with Patient 08/18/23 [phone]     (approximate) History  Chest Pain  HPI Nicole Garcia is a 77 y.o. female with a stated past medical history of hypertension who presents complaining of chest pain that occurred while taking down Christmas decorations today.  Patient states that she felt a tightness across the anterior chest that was mildly improved with rest however when she awoke from a nap felt a pain in the thoracic back area as well.  Patient states that she now feels left upper extremity weakness but only along the deltoid region.  Patient states that this pain has improved since waiting in our waiting room. ROS: Patient currently denies any vision changes, tinnitus, difficulty speaking, facial droop, sore throat, shortness of breath, abdominal pain, nausea/vomiting/diarrhea, dysuria, or weakness/numbness/paresthesias in any extremity   Physical Exam  Triage Vital Signs: ED Triage Vitals  Encounter Vitals Group     BP 08/17/23 1713 (!) 177/93     Systolic BP Percentile --      Diastolic BP Percentile --      Pulse Rate 08/17/23 1713 (!) 102     Resp 08/17/23 1713 16     Temp 08/17/23 1713 98.4 F (36.9 C)     Temp Source 08/17/23 1713 Oral     SpO2 08/17/23 1713 99 %     Weight --      Height --      Head Circumference --      Peak Flow --      Pain Score 08/17/23 1712 8     Pain Loc --      Pain Education --      Exclude from Growth Chart --    Most recent vital signs: Vitals:   08/18/23 0432 08/18/23 0612  BP: (!) 161/68 (!) 163/72  Pulse: 72 76  Resp: 18 20  Temp:  98.6 F (37 C)  SpO2: 96% 98%   General: Awake, oriented x4. CV:  Good peripheral perfusion.  Resp:  Normal effort.  Abd:  No distention.  Other:  Elderly obese Caucasian female resting comfortably in no acute distress ED Results / Procedures / Treatments  Labs (all labs ordered are listed, but only  abnormal results are displayed) Labs Reviewed  BASIC METABOLIC PANEL - Abnormal; Notable for the following components:      Result Value   Glucose, Bld 184 (*)    All other components within normal limits  CBC WITH DIFFERENTIAL/PLATELET  TROPONIN I (HIGH SENSITIVITY)  TROPONIN I (HIGH SENSITIVITY)   EKG ED ECG REPORT I, Merwyn Katos, the attending physician, personally viewed and interpreted this ECG. Date: 08/18/2023 EKG Time: 1711 Rate: 105 Rhythm: Tachycardic sinus rhythm QRS Axis: normal Intervals: normal ST/T Wave abnormalities: normal Narrative Interpretation: Tachycardic sinus rhythm.  No evidence of acute ischemia RADIOLOGY ED MD interpretation: 2 view chest x-ray interpreted by me shows no evidence of acute abnormalities including no pneumonia, pneumothorax, or widened mediastinum -Agree with radiology assessment Official radiology report(s): DG Chest 2 View Result Date: 08/17/2023 CLINICAL DATA:  Chest pain EXAM: CHEST - 2 VIEW COMPARISON:  09/06/2017 FINDINGS: The heart size and mediastinal contours are within normal limits. Both lungs are clear. The visualized skeletal structures are unremarkable. IMPRESSION: No active cardiopulmonary disease. Electronically Signed   By: Duanne Guess D.O.   On: 08/17/2023 17:45   PROCEDURES: Critical Care performed: No Procedures MEDICATIONS ORDERED  IN ED: Medications  metoprolol tartrate (LOPRESSOR) tablet 12.5 mg (12.5 mg Oral Given 08/18/23 0031)  acetaminophen (TYLENOL) tablet 650 mg (650 mg Oral Given 08/18/23 0031)   IMPRESSION / MDM / ASSESSMENT AND PLAN / ED COURSE  I reviewed the triage vital signs and the nursing notes.                             The patient is on the cardiac monitor to evaluate for evidence of arrhythmia and/or significant heart rate changes. Patient's presentation is most consistent with acute presentation with potential threat to life or bodily function. Workup: ECG, CXR, CBC, BMP,  Troponin Findings: ECG: No overt evidence of STEMI. No evidence of Brugada's sign, delta wave, epsilon wave, significantly prolonged QTc, or malignant arrhythmia HS Troponin: Negative x1 Other Labs unremarkable for emergent problems. CXR: Without PTX, PNA, or widened mediastinum Last Stress Test:  2016 Last Heart Catheterization:  NA HEART Score: 4  Given History, Exam, and Workup I have low suspicion for ACS, Pneumothorax, Pneumonia, Pulmonary Embolus, Tamponade, Aortic Dissection or other emergent problem as a cause for this presentation.   Reassesment: Prior to discharge patient's pain was controlled and they were well appearing.  Disposition:  Discharge. Strict return precautions discussed with patient with full understanding. Advised patient to follow up promptly with primary care provider   FINAL CLINICAL IMPRESSION(S) / ED DIAGNOSES   Final diagnoses:  Chest pain, unspecified type  Palpitations  Hypertension, unspecified type   Rx / DC Orders   ED Discharge Orders          Ordered    Ambulatory referral to Cardiology       Comments: If you have not heard from the Cardiology office within the next 72 hours please call [phone].   08/18/23 2952           Note:  This document was prepared using Dragon voice recognition software and may include unintentional dictation errors.   Merwyn Katos, MD 08/18/23 [phone]

## 2023-08-18 NOTE — Telephone Encounter (Signed)
   Pt c/o BP issue: STAT if pt c/o blurred vision, one-sided weakness or slurred speech  1. What are your last 5 BP readings? 198/89 - Last night  2. Are you having any other symptoms (ex. Dizziness, headache, blurred vision, passed out)? Headache  3. What is your BP issue? Pt states that BP has been elevated since yesterday. She would like a c/b regarding this matter as well as ED visit

## 2023-08-18 NOTE — Patient Instructions (Signed)
Medication Instructions:   metoprolol succinate (TOPROL-XL) 50 MG 24 hr tablet - Take 1 tablet (50 mg total) by mouth daily  amLODipine (NORVASC) 2.5 MG tablet - Take 1 tablet (2.5 mg total) by mouth daily  *If you need a refill on your cardiac medications before your next appointment, please call your pharmacy*  Lab Work: - None ordered  Testing/Procedures: How to Prepare for Your Myoview Test (stress test):  Please do not take these medications before your test: metoprolol succinate (TOPROL-XL) 50 MG 24 hr tablet  Your remaining medications may be taken with water. Nothing to eat or drink, except water, 4 hours prior to arrival time.  NO caffeine/decaffeinated products, or chocolate 12 hours prior to arrival. [place], please do not wear dresses.  Skirts or pants are approprate, please wear a short sleeve shirt. NO perfume, cologne or lotion Wear comfortable walking shoes.  NO HEELS! Total time is 3 to 4 hours; you may want to bring reading material for the waiting time. Please report to [facility] for your test  What to expect after you arrive:  Once you arrive and check in for your appointment an IV will be started in your arm.  Then the Technoligist will inject a small amount of radioactive tracer.  There will be a 1 hour waiting period after this injection.  A series of pictures will be taken of your heart following this waiting period.  You will be prepped for the stress portion of the test.  During the stress portion of your test you will either walk on a treadmill or receive a small, safe amount of radioactive tracer injected in your IV.  After the stress portion, there is a short rest period during which time your heart and blood pressure will be monitored.  After the short rest period the Technologist will begin your second set of pictures.  Your doctor will inform you of your test results within 7-10 business days.  In preparation for your appointment, medication and supplies  will be purchased.  Appointment availability is limited, so if you need to cancel or reschedule please call the office at [phone] 24 hours in advance to avoid a cancellation fee of $100.00  IF YOU THINK YOU MAY BE PREGNANT, PLEASE INFORM THE TECHNOLOGIST.   Follow-Up: At [facility], you and your health needs are our priority.  As part of our continuing mission to provide you with exceptional heart care, we have created designated Provider Care Teams.  These Care Teams include your primary Cardiologist (physician) and Advanced Practice Providers (APPs -  Physician Assistants and Nurse Practitioners) who all work together to provide you with the care you need, when you need it.  Your next appointment:   1 month(s)  Provider:   Terrilee Croak, PA-C

## 2023-08-21 DIAGNOSIS — E782 Mixed hyperlipidemia: Secondary | ICD-10-CM | POA: Diagnosis not present

## 2023-08-21 DIAGNOSIS — E1169 Type 2 diabetes mellitus with other specified complication: Secondary | ICD-10-CM | POA: Diagnosis not present

## 2023-08-21 DIAGNOSIS — E538 Deficiency of other specified B group vitamins: Secondary | ICD-10-CM | POA: Diagnosis not present

## 2023-08-25 ENCOUNTER — Encounter
Admission: RE | Admit: 2023-08-25 | Discharge: 2023-08-25 | Disposition: A | Discharge status: Home / Self Care | Payer: PPO | Source: Ambulatory Visit | Attending: Medical | Admitting: Medical

## 2023-08-25 DIAGNOSIS — R079 Chest pain, unspecified: Secondary | ICD-10-CM | POA: Diagnosis not present

## 2023-08-25 LAB — NM MYOCAR MULTI W/SPECT W/WALL MOTION / EF
LV dias vol: 49 mL (ref 46–106)
LV sys vol: 20 mL
Nuc Stress EF: 59 %
Peak HR: 126 {beats}/min
Percent HR: 87 %
Rest HR: 92 {beats}/min
Rest Nuclear Isotope Dose: 10.7 mCi
SDS: 0
SRS: 2
SSS: 2
ST Depression (mm): 0 mm
Stress Nuclear Isotope Dose: 30.5 mCi
TID: 0.53

## 2023-08-25 MED ORDER — TECHNETIUM TC 99M TETROFOSMIN IV KIT
10.7200 | PACK | Freq: Once | INTRAVENOUS | Status: AC | PRN
  Administered 2023-08-25: 10.72 via INTRAVENOUS

## 2023-08-25 MED ORDER — TECHNETIUM TC 99M TETROFOSMIN IV KIT
30.4700 | PACK | Freq: Once | INTRAVENOUS | Status: AC | PRN
  Administered 2023-08-25: 30.47 via INTRAVENOUS

## 2023-08-25 MED ORDER — REGADENOSON 0.4 MG/5ML IV SOLN
0.4000 mg | Freq: Once | INTRAVENOUS | Status: AC
  Administered 2023-08-25: 0.4 mg via INTRAVENOUS

## 2023-08-26 ENCOUNTER — Telehealth: Payer: Self-pay | Admitting: Cardiovascular Disease

## 2023-08-26 NOTE — Telephone Encounter (Signed)
 Patient states she is returning a call to discuss stress test results.

## 2023-08-26 NOTE — Telephone Encounter (Signed)
 Spoke to patient and informed her of most recent stress test results as follows:  Stress test was overall normal and low risk with no evidence of ischemia. Images showed mild aortic calcifications, but no coronary calcification. Overall very reassuring.  Patient understood with read back

## 2023-08-27 DIAGNOSIS — M5136 Other intervertebral disc degeneration, lumbar region with discogenic back pain only: Secondary | ICD-10-CM | POA: Diagnosis not present

## 2023-08-27 DIAGNOSIS — E1169 Type 2 diabetes mellitus with other specified complication: Secondary | ICD-10-CM | POA: Diagnosis not present

## 2023-08-27 DIAGNOSIS — E782 Mixed hyperlipidemia: Secondary | ICD-10-CM | POA: Diagnosis not present

## 2023-08-27 DIAGNOSIS — Z0000 Encounter for general adult medical examination without abnormal findings: Secondary | ICD-10-CM | POA: Diagnosis not present

## 2023-09-01 ENCOUNTER — Telehealth: Payer: Self-pay | Admitting: Cardiovascular Disease

## 2023-09-01 NOTE — Telephone Encounter (Signed)
 I spoke with the patient.  She advised that she went to [facility] yesterday due to her brother in law being hospitalized.  She parked the car and by the time she walked all the way to his room- 30 minutes later her passed away. She noticed she didn't feel well and checked her HR with her Apple Watch. HR readings were 122 bpm. The patient states she has not been very active the last few days with the weather and was already nervous from the family being called in for her BIL. She had taken Xanax prior to going to [facility]. HR did not get below 90 bpm last night. HR is currently 87 bpm and SBP has been ~ 140. She is with her sister at the moment and concerned about her.  The patient did an EKG tracing with her Apple Watch and she was noted to be in NSR.   Typical HR are ~ 74 bpm.  The patient is currently on metoprolol  succinate 50 mg qAM. She states she is just uncomfortable with her readings being a little elevated.  I have advised the patient that her increased HR's are most likely related to the events of the last 24 hours. She is aware that she can take an extra 1/2 metoprolol  succinate (25 mg) now to see if this help. Recommended she continue to use PRN Xanax as well.  The patient will continue to monitor her HR's over the next several days and call back if symptoms worsen.   She voices understanding and is agreeable.  She also wanted to let Dr. Darron know that he brother in law was his patient: Rutha Shams (DOB ~ [birth year]) [address]

## 2023-09-01 NOTE — Telephone Encounter (Signed)
 Patient c/o Palpitations:  STAT if patient reporting lightheadedness, shortness of breath, or chest pain  How long have you had palpitations/irregular HR/ Afib? Are you having the symptoms now? Started yesterday, yes  Are you currently experiencing lightheadedness, SOB or CP? lightheadedness  Do you have a history of afib (atrial fibrillation) or irregular heart rhythm? yes  Have you checked your BP or HR? (document readings if available): 140/74 hr 81 last night 138/81 90hr today  Are you experiencing any other symptoms? no

## 2023-09-12 DIAGNOSIS — H5712 Ocular pain, left eye: Secondary | ICD-10-CM | POA: Diagnosis not present

## 2023-09-23 ENCOUNTER — Encounter: Payer: Self-pay | Admitting: Medical

## 2023-09-23 ENCOUNTER — Ambulatory Visit: Payer: PPO | Attending: Medical | Admitting: Medical

## 2023-09-23 VITALS — BP 130/74 | HR 81 | Ht <= 58 in | Wt 180.8 lb

## 2023-09-23 DIAGNOSIS — I10 Essential (primary) hypertension: Secondary | ICD-10-CM

## 2023-09-23 DIAGNOSIS — R002 Palpitations: Secondary | ICD-10-CM

## 2023-09-23 DIAGNOSIS — E1169 Type 2 diabetes mellitus with other specified complication: Secondary | ICD-10-CM

## 2023-09-23 DIAGNOSIS — E782 Mixed hyperlipidemia: Secondary | ICD-10-CM

## 2023-09-23 DIAGNOSIS — R079 Chest pain, unspecified: Secondary | ICD-10-CM | POA: Diagnosis not present

## 2023-09-23 NOTE — Progress Notes (Addendum)
 Cardiology Office Note:  .   Date:  09/23/2023  ID:  Nicole Garcia, DOB 04/28/1946, MRN [id number] PCP: Cleotilde Oneil FALCON, MD  [facility] Providers Cardiologist:  Deatrice Cage, MD     History of Present Illness: Nicole   ISELLA Garcia is a 78 y.o. female with a h/o palpitations and hypertension, diabetes type 2, GERD who presents for follow-up.   Patient was seen in 2016 for atypical chest pain and shortness of breath.  A treadmill nuclear stress test at that time showed no evidence of ischemia with normal ejection fraction.  Echo was overall unremarkable except for mild diastolic dysfunction.   Patient takes amlodipine  and Toprol  for elevated blood pressure.  Beta-blocker has been increased due to palpitations.  Heart monitor showed sinus rhythm with rare PACs and PVCs.  Patient was last seen December 2024 for ER follow-up for chest pain, ruled out ACS.  Toprol  was increased and amlodipine  was restarted.  Myoview  ETT was ordered.  Stress test turned into Lexi scan, this was a normal study and low risk with normal perfusion and no evidence of ischemia.  CT images showed mild aortic calcifications but no coronary calcification.  Today, the patient reports HR is better. On exertion heart rate goes up to 120. She has no symptoms. She is watching HR on her watch. Feels she can't do a whole lot of exertion or the heart rate will go up. PCP changed acid reflux medication and this has helped her chest discomfort.    Studies Reviewed: Nicole   EKG Interpretation Date/Time:  Tuesday September 23 2023 13:50:15 EST Ventricular Rate:  81 PR Interval:  154 QRS Duration:  68 QT Interval:  360 QTC Calculation: 418 R Axis:   3  Text Interpretation: Normal sinus rhythm Low voltage QRS Possible Inferior infarct (cited on or before 17-Aug-2023) When compared with ECG of 18-Aug-2023 14:49, No significant change was found Confirmed by Franchester, Merlon Alcorta ([id number]) on 09/23/2023 1:53:09 PM   Myoview  lexiscan   08/2023   The study is normal. The study is low risk.   No ST deviation was noted.   LV perfusion is normal. There is no evidence of ischemia. There is no evidence of infarction.   Left ventricular function is normal. Nuclear stress EF: 59%. End diastolic cavity size is normal. End systolic cavity size is normal.   CT attenuation images showed mild aortic calcifications but no coronary calcifications.  Heart monitor 11/2021 Patch Wear Time:  6 days and 23 hours (2023-03-31T17:36:17-0400 to 2023-04-07T17:06:08-398)   Patient had a min HR of 61 bpm, max HR of 128 bpm, and avg HR of 88 bpm. Predominant underlying rhythm was Sinus Rhythm. Rare PACs and rare PVCs.  No significant arrhythmia overall.   ETT 08/2017 Blood pressure demonstrated a hypertensive response to exercise. There was no ST segment deviation noted during stress. No T wave inversion was noted during stress.   Normal treadmill stress test with no evidence of ischemia. Poor exercise capacity with hypertensive response to exercise. Exercise duration was 3 minutes and 22 seconds correlating with 5 Mets. Peak blood pressure was 191/84   Echo 08/2017 Study Conclusions   - Left ventricle: The cavity size was normal. Systolic function was    normal. The estimated ejection fraction was in the range of 60%    to 65%. Wall motion was normal; there were no regional wall    motion abnormalities. Doppler parameters are consistent with    abnormal left ventricular relaxation (grade  1 diastolic    dysfunction).  - Left atrium: The atrium was normal in size.  - Right ventricle: Systolic function was normal.  - Pulmonary arteries: Systolic pressure was within the normal    range.            Physical Exam:   VS:  BP 130/74 (BP Location: Left Arm, Patient Position: Sitting, Cuff Size: Normal)   Pulse 81   Ht 4' 10 (1.473 m)   Wt 180 lb 12.8 oz (82 kg)   SpO2 96%   BMI 37.79 kg/m    Wt Readings from Last 3 Encounters:  09/23/23  180 lb 12.8 oz (82 kg)  08/18/23 177 lb 9.6 oz (80.6 kg)  06/20/22 181 lb 6 oz (82.3 kg)    GEN: Well nourished, well developed in no acute distress NECK: No JVD; No carotid bruits CARDIAC: RRR, no murmurs, rubs, gallops RESPIRATORY:  Clear to auscultation without rales, wheezing or rhonchi  ABDOMEN: Soft, non-tender, non-distended EXTREMITIES:  No edema; No deformity   ASSESSMENT AND PLAN: .    Chest pain Recent Myoview  lexiscan  was normal, low risk with no ischemia. CT imaging showed mild aortic calcifications but no coronary artery calcifications. GERD medication was adjusted by PCP, and this has improved chest discomfort. No further chest pain reported. No further ischemic work-up at this time. Can consider ASA and cholesterol medication.   HTN BP is normal today. Continue amlodipine  2.5mg  daily and Toprol  50mg  daily.  Palpitations She denies palpitations. She reports elevated HR on exertion, but no symptoms. Prior heart monitor in 2023 showed NSR, rare PACs and PVCs. Continue Toprol  50mg  daily.  HLD LDL 110, goal<70. Statin, Zetia , Repatha discussed. Patient is not wanting to take statins. Can re-visit with Dr. Darron next month.   DM2 A1C 7.9. This is managed by PCP.     Dispo: Follow-up next month with Dr. Darron  Signed, Airis Barbee VEAR Fishman, PA-C

## 2023-09-23 NOTE — Patient Instructions (Signed)
Medication Instructions:  Your physician recommends that you continue on your current medications as directed. Please refer to the Current Medication list given to you today.   *If you need a refill on your cardiac medications before your next appointment, please call your pharmacy*   Lab Work: No labs ordered today    Testing/Procedures: No test ordered today    Follow-Up: At [facility], you and your health needs are our priority.  As part of our continuing mission to provide you with exceptional heart care, we have created designated Provider Care Teams.  These Care Teams include your primary Cardiologist (physician) and Advanced Practice Providers (APPs -  Physician Assistants and Nurse Practitioners) who all work together to provide you with the care you need, when you need it.  We recommend signing up for the patient portal called "MyChart".  Sign up information is provided on this After Visit Summary.  MyChart is used to connect with patients for Virtual Visits (Telemedicine).  Patients are able to view lab/test results, encounter notes, upcoming appointments, etc.  Non-urgent messages can be sent to your provider as well.   To learn more about what you can do with MyChart, go to [email or web address].    Your next appointment:   October 30, 2023 @ 8 am  Provider:   Lorine Bears, MD

## 2023-10-10 ENCOUNTER — Ambulatory Visit
Admission: EM | Admit: 2023-10-10 | Discharge: 2023-10-10 | Disposition: A | Discharge status: Home / Self Care | Payer: PPO | Attending: Emergency Medicine | Admitting: Emergency Medicine

## 2023-10-10 DIAGNOSIS — J040 Acute laryngitis: Secondary | ICD-10-CM | POA: Diagnosis not present

## 2023-10-10 DIAGNOSIS — J069 Acute upper respiratory infection, unspecified: Secondary | ICD-10-CM

## 2023-10-10 DIAGNOSIS — R051 Acute cough: Secondary | ICD-10-CM | POA: Diagnosis not present

## 2023-10-10 MED ORDER — BENZONATATE 100 MG PO CAPS: 100.0000 mg | ORAL_CAPSULE | Freq: Three times a day (TID) | ORAL | 0 refills | Status: DC | PRN

## 2023-10-10 NOTE — Discharge Instructions (Addendum)
 Take the [facility] as directed.  Follow up with your primary care provider if your symptoms are not improving.

## 2023-10-10 NOTE — ED Triage Notes (Signed)
 Pt states that she has a loss of voice, coughing and nausea. x50month

## 2023-10-10 NOTE — ED Provider Notes (Signed)
 Nicole Garcia    CSN: [id number] Arrival date & time: 10/10/23  1325      History   Chief Complaint Chief Complaint  Patient presents with   Laryngitis    Loss of voice, cough, and nausea    HPI Nicole Garcia is a 78 y.o. female.  Patient presents with 2-day history of hoarse voice, postnasal drip, cough.  She reports intermittent nausea x 1 month; she has Zofran for this but has not taken it; she took Dramamine last night.  She took Tylenol and Claritin last night.  She also took hydrocodone cough syrup last night that had been prescribed in 2023.  She denies fever, shortness of breath, chest pain, abdominal pain, nausea, vomiting, diarrhea, constipation.  The history is provided by the patient and medical records.    Past Medical History:  Diagnosis Date   Arthritis    Atypical chest pain    a. 08/2014 Ex MV: EF 70%, no ischemia/infarct; b. 08/2017 ETT: Ex time 3:22. HTN response. No ST/T changes.   Concussion    Depression    Diabetes mellitus without complication (HCC)    Diastolic dysfunction    a. 08/2017 Echo: EF 60%, no rwma, GrI DD, nl RV fxn.   Diverticulitis    GERD (gastroesophageal reflux disease)    History of chicken pox    Hyperglycemia    Hypertension    Physical deconditioning    a. 08/2017 ETT: Ex time 3:22.    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/2020   B12 deficiency 06/08/2019   Tubular adenoma 06/08/2019   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HCC) 01/21/2018   DDD (degenerative disc disease), lumbar 03/11/2016   Vaginal lesion 05/08/2015   Paronychia, toe 04/06/2015   Renal insufficiency 04/06/2015   Diverticulitis 03/22/2015   Screening for breast cancer 03/22/2015   Obesity (BMI 30-39.9) 03/22/2015   Muscle spasm 01/24/2015   Diabetes mellitus, type 2 (HCC) 01/11/2015   Adjustment disorder with mixed anxiety and depressed mood 12/29/2007   Essential hypertension 12/29/2007   REFLUX ESOPHAGITIS 12/15/2007   IRRITABLE BOWEL SYNDROME 12/15/2007    Past Surgical History:  Procedure Laterality Date   ABDOMINAL HYSTERECTOMY     BLADDER SUSPENSION  1997   microinvasive Protogen   BREAST EXCISIONAL BIOPSY Left    BREAST EXCISIONAL BIOPSY Right    BREAST SURGERY     biopsy 1966 and 2000   CATARACT EXTRACTION, BILATERAL     CHOLECYSTECTOMY     ROOT CANAL     TOE SURGERY     VAGINAL DELIVERY     x2    OB History   No obstetric history on file.      Home Medications    Prior to Admission medications   Medication Sig Start Date End Date Taking? Authorizing Provider  Acetaminophen (TYLENOL PO) Take by mouth. Am and pm as needed.   Yes  [provider]  ALPRAZolam Prudy Feeler) 1 MG tablet Take 1 mg by mouth at bedtime as needed.  04/27/19  Yes [provider]  amLODipine (NORVASC) 2.5 MG tablet Take 1 tablet (2.5 mg total) by mouth daily. 08/18/23  Yes Furth, Cadence H, PA-C  amoxicillin (AMOXIL) 500 MG capsule Take 500 mg by mouth every 8 (eight) hours. 05/16/22  Yes [provider]  benzonatate (TESSALON) 100 MG capsule Take 1 capsule (100 mg total) by mouth 3 (three) times daily as needed for cough. 10/10/23  Yes Mickie Bail, NP  chlorhexidine (PERIDEX) 0.12 % solution  03/28/22  Yes [provider]  cholecalciferol (VITAMIN D) 1000 UNITS tablet Take 1,000 Units by  mouth daily.   Yes [provider]  clobetasol (OLUX) 0.05 % topical foam Apply topically 2 (two) times daily. 01/22/22  Yes [provider]  empagliflozin (JARDIANCE) 25 MG TABS tablet Take 25 mg by mouth daily.   Yes [provider]  furosemide (LASIX) 20 MG tablet Take by mouth. 07/04/22  Yes [provider]  gabapentin (NEURONTIN) 100 MG capsule Take 100 mg by mouth at bedtime.   Yes [provider]  glimepiride (AMARYL) 1 MG tablet Take 1 mg by mouth. 3 times per day   Yes [provider]  hydrocortisone (ANUSOL-HC) 25 MG suppository Place 1 suppository (25 mg total) rectally 2 (two) times daily. 07/09/22  Yes Lamptey, Britta Mccreedy, MD  JANUVIA 25 MG tablet Take 25 mg by mouth daily.   Yes [provider]  metoprolol succinate (TOPROL-XL) 50 MG 24 hr tablet Take 1 tablet (50 mg total) by mouth daily. Due yearly visit.  PLEASE CALL OFFICE TO SCHEDULE APPOINTMENT PRIOR TO NEXT REFILL (first attempt) 08/18/23  Yes Furth, Cadence H, PA-C  Multiple Vitamin (MULTIVITAMIN) capsule Take 1 capsule by mouth daily.   Yes [provider]  Omega-3 Fatty Acids (FISH OIL PO) Take by mouth 2 (two) times daily at 10 AM and 5 PM.   Yes [provider]  omeprazole (PRILOSEC) 20 MG capsule Take 20 mg by mouth daily.   Yes [provider]  penicillin v potassium (VEETID) 500 MG tablet Take 500 mg by mouth every 8 (eight) hours. 05/22/22  Yes [provider]  Probiotic Product (PROBIOTIC PO) Take 1 tablet by mouth daily.   Yes [provider]  senna-docusate (SENOKOT-S) 8.6-50 MG tablet Take 1 tablet by mouth at bedtime. 07/09/22  Yes Lamptey, Britta Mccreedy, MD  sertraline (ZOLOFT) 25 MG tablet Take 1 tablet by mouth daily. 02/13/22 02/13/23  [provider]    Family History Family History  Problem Relation Age of Onset   Diabetes Father    Cancer Sister 68       pancreatic   Cancer Brother 13       brain    Cancer Sister        lung   Diabetes Sister    Macular degeneration Sister    Kidney failure Sister    Tremor Sister    Healthy Son     Social History Social History   Tobacco Use   Smoking status: Never   Smokeless tobacco: Never  Vaping Use   Vaping status: Never Used  Substance Use Topics   Alcohol use: No    Alcohol/week: 0.0 standard drinks of alcohol   Drug use: No     Allergies   Iohexol, 5-alpha reductase inhibitors, Bactroban [mupirocin calcium], Ciprofloxacin, Codeine, Levofloxacin, Linaclotide, Metformin and related,  Nitrofurantoin, and Sulfonamide derivatives   Review of Systems Review of Systems  Constitutional:  Negative for chills and fever.  HENT:  Positive for postnasal drip and voice change. Negative for ear pain and sore throat.   Respiratory:  Positive for cough. Negative for shortness of breath.   Cardiovascular:  Negative for chest pain and palpitations.  Gastrointestinal:  Positive for nausea. Negative for abdominal pain, constipation, diarrhea and vomiting.     Physical Exam Triage Vital Signs ED Triage Vitals  Encounter Vitals Group     BP 10/10/23 1509 139/74     Systolic BP Percentile --      Diastolic BP Percentile --      Pulse Rate 10/10/23 1509 81     Resp 10/10/23 1509 17     Temp 10/10/23 1509 (!) 97.3 F (36.3 C)     Temp Source 10/10/23 1509 Temporal     SpO2 10/10/23 1509 96 %     Weight 10/10/23 1507 175 lb (79.4 kg)     Height 10/10/23 1507 4\' 10"  (1.473 m)     Head Circumference --      Peak Flow --      Pain Score 10/10/23 1507 0     Pain Loc --      Pain Education --      Exclude from Growth Chart --    No data found.  Updated Vital Signs BP 139/74 (BP Location: Left Arm)   Pulse 81   Temp (!) 97.3 F (36.3 C) (Temporal)   Resp 17   Ht 4\' 10"  (1.473 m)   Wt 175 lb (79.4 kg)   SpO2 96%   BMI 36.58 kg/m   Visual Acuity Right Eye Distance:   Left Eye Distance:   Bilateral Distance:    Right Eye  Near:   Left Eye Near:    Bilateral Near:     Physical Exam Constitutional:      General: She is not in acute distress. HENT:     Right Ear: Tympanic membrane normal.     Left Ear: Tympanic membrane normal.     Nose: Nose normal.     Mouth/Throat:     Mouth: Mucous membranes are moist.     Pharynx: Oropharynx is clear.     Comments: Clear PND.  Cardiovascular:     Rate and Rhythm: Normal rate and regular rhythm.     Heart sounds: Normal heart sounds.  Pulmonary:     Effort: Pulmonary effort is normal. No respiratory distress.     Breath sounds: Normal breath sounds.  Neurological:     Mental Status: She is alert.      UC Treatments / Results  Labs (all labs ordered are listed, but only abnormal results are displayed) Labs Reviewed - No data to display  EKG   Radiology No results found.  Procedures Procedures (including critical care time)  Medications Ordered in UC Medications - No data to display  Initial Impression / Assessment and Plan / UC Course  I have reviewed the triage vital signs and the nursing notes.  Pertinent labs & imaging results that were available during my care of the patient were reviewed by me and considered in my medical decision making (see chart for details).    Viral URI, cough, laryngitis.  Afebrile and vital signs are stable.  Lungs are clear and O2 sat is 96% on room air.  It would appear the patient took Claritin, Dramamine, and hydrocodone cough syrup  last night.  Discussed that she should not combine these medications.  The hydrocodone cough syrup was prescribed in 2023; discussed that she should discard this.  Treating her cough today with Tessalon Perles.  Instructed her to take Claritin and Tylenol as needed.  Instructed her to follow-up with her PCP next week if she is not improving.  Education provided on viral URI and laryngitis.  She agrees to plan of care.  Final Clinical Impressions(s) / UC Diagnoses   Final diagnoses:   Viral URI  Acute cough  Laryngitis     Discharge Instructions      Take the Tessalon Perles as directed.  Follow-up with your primary care provider if your symptoms are not improving.      ED Prescriptions     Medication Sig Dispense Auth. Provider   benzonatate (TESSALON) 100 MG capsule Take 1 capsule (100 mg total) by mouth 3 (three) times daily as needed for cough. 21 capsule Mickie Bail, NP      PDMP not reviewed this encounter.   Mickie Bail, NP 10/10/23 [phone]

## 2023-10-14 DIAGNOSIS — R0982 Postnasal drip: Secondary | ICD-10-CM | POA: Diagnosis not present

## 2023-10-14 DIAGNOSIS — J040 Acute laryngitis: Secondary | ICD-10-CM | POA: Diagnosis not present

## 2023-10-30 ENCOUNTER — Encounter: Payer: Self-pay | Admitting: Cardiovascular Disease

## 2023-10-30 ENCOUNTER — Ambulatory Visit: Payer: PPO | Attending: Cardiovascular Disease | Admitting: Cardiovascular Disease

## 2023-10-30 VITALS — BP 132/70 | HR 88 | Ht 59.0 in | Wt 176.8 lb

## 2023-10-30 DIAGNOSIS — R079 Chest pain, unspecified: Secondary | ICD-10-CM | POA: Diagnosis not present

## 2023-10-30 DIAGNOSIS — R002 Palpitations: Secondary | ICD-10-CM

## 2023-10-30 DIAGNOSIS — I10 Essential (primary) hypertension: Secondary | ICD-10-CM

## 2023-10-30 DIAGNOSIS — E785 Hyperlipidemia, unspecified: Secondary | ICD-10-CM

## 2023-10-30 MED ORDER — ROSUVASTATIN CALCIUM 5 MG PO TABS
5.0000 mg | ORAL_TABLET | Freq: Every day | ORAL | 3 refills | Status: DC
Start: 2023-10-30 — End: 2024-04-09

## 2023-10-30 NOTE — Progress Notes (Signed)
 Cardiology Office Note   Date:  10/30/2023   ID:  Nicole Garcia, Nicole Garcia April 30, 1946, MRN [id number]  PCP:  Danella Penton, MD  Cardiologist:   Lorine Bears, MD   Chief Complaint  Patient presents with   Follow-up    12 month follow up visit. Patient states that this morning she woke up with shortness of breath and a headache. Meds reviewed.       History of Present Illness: Nicole Garcia is a 78 y.o. female who presents for a follow-up visit regarding palpitations and hypertension.   She has known history of hypertension, type 2 diabetes and GERD. She was seen by me in 2016 for atypical chest pain and shortness of breath. A treadmill nuclear stress test in January 2016  showed no evidence of ischemia with normal ejection fraction. Echocardiogram was overall unremarkable except for mild diastolic dysfunction.   Unfortunately, her husband died in 2022/07/23due to GI bleed.  She had significant anxiety and depression since then.  She had elevated blood pressure and palpitations after that.   She underwent 1 week outpatient monitor in 2023 which showed sinus rhythm with rare PACs and rare PVCs.  No significant arrhythmia.  She was evaluated recently for chest pain and underwent a Lexiscan Myoview that showed no evidence of ischemia with normal ejection fraction.  CT attenuation images showed mild aortic calcifications with no coronary calcifications noted.  She reports no recurrent chest pain.  Shortness of breath is stable.  She feels that her palpitations are controlled with Toprol.  Past Medical History:  Diagnosis Date   Arthritis    Atypical chest pain    a. 08/2014 Ex MV: EF 70%, no ischemia/infarct; b. 08/2017 ETT: Ex time 3:22. HTN response. No ST/T changes.   Concussion    Depression    Diabetes mellitus without complication (HCC)    Diastolic dysfunction    a. 08/2017 Echo: EF 60%, no rwma, GrI DD, nl RV fxn.   Diverticulitis    GERD (gastroesophageal reflux disease)     History of chicken pox    Hyperglycemia    Hypertension    Physical deconditioning    a. 08/2017 ETT: Ex time 3:22.    Past Surgical History:  Procedure Laterality Date   ABDOMINAL HYSTERECTOMY     BLADDER SUSPENSION  1997   microinvasive Protogen   BREAST EXCISIONAL BIOPSY Left    BREAST EXCISIONAL BIOPSY Right    BREAST SURGERY     biopsy 1966 and 2000   CATARACT EXTRACTION, BILATERAL     CHOLECYSTECTOMY     ROOT CANAL     TOE SURGERY     VAGINAL DELIVERY     x2     Current Outpatient Medications  Medication Sig Dispense Refill   Acetaminophen (TYLENOL PO) Take by mouth. Am and pm as needed.     ALPRAZolam (XANAX) 1 MG tablet Take 1 mg by mouth at bedtime as needed.      amLODipine (NORVASC) 2.5 MG tablet Take 1 tablet (2.5 mg total) by mouth daily. 90 tablet 3   chlorhexidine (PERIDEX) 0.12 % solution      cholecalciferol (VITAMIN D) 1000 UNITS tablet Take 1,000 Units by mouth daily.     clobetasol (OLUX) 0.05 % topical foam Apply topically 2 (two) times daily.     furosemide (LASIX) 20 MG tablet Take by mouth.     gabapentin (NEURONTIN) 100 MG capsule Take 100 mg by mouth at bedtime.  glimepiride (AMARYL) 1 MG tablet Take 1 mg by mouth. 3 times per day     hydrocortisone (ANUSOL-HC) 25 MG suppository Place 1 suppository (25 mg total) rectally 2 (two) times daily. 12 suppository 0   JANUVIA 25 MG tablet Take 25 mg by mouth daily.     metoprolol succinate (TOPROL-XL) 50 MG 24 hr tablet Take 1 tablet (50 mg total) by mouth daily. Due yearly visit.  PLEASE CALL OFFICE TO SCHEDULE APPOINTMENT PRIOR TO NEXT REFILL (first attempt) 90 tablet 3   Multiple Vitamin (MULTIVITAMIN) capsule Take 1 capsule by mouth daily.     Omega-3 Fatty Acids (FISH OIL PO) Take by mouth 2 (two) times daily at 10 AM and 5 PM.     omeprazole (PRILOSEC) 20 MG capsule Take 20 mg by mouth daily.     penicillin v potassium (VEETID) 500 MG tablet Take 500 mg by mouth every 8 (eight) hours.      Probiotic Product (PROBIOTIC PO) Take 1 tablet by mouth daily.     senna-docusate (SENOKOT-S) 8.6-50 MG tablet Take 1 tablet by mouth at bedtime. 30 tablet 0   amoxicillin (AMOXIL) 500 MG capsule Take 500 mg by mouth every 8 (eight) hours. (Patient not taking: Reported on 10/30/2023)     benzonatate (TESSALON) 100 MG capsule Take 1 capsule (100 mg total) by mouth 3 (three) times daily as needed for cough. (Patient not taking: Reported on 10/30/2023) 21 capsule 0   empagliflozin (JARDIANCE) 25 MG TABS tablet Take 25 mg by mouth daily. (Patient not taking: Reported on 10/30/2023)     sertraline (ZOLOFT) 25 MG tablet Take 1 tablet by mouth daily.     No current facility-administered medications for this visit.    Allergies:   Iohexol, 5-alpha reductase inhibitors, Bactroban [mupirocin calcium], Ciprofloxacin, Codeine, Levofloxacin, Linaclotide, Metformin and related, Nitrofurantoin, and Sulfonamide derivatives    Social History:  The patient  reports that she has never smoked. She has never used smokeless tobacco. She reports that she does not drink alcohol and does not use drugs.   Family History:  The patient's family history includes Cancer in her sister; Cancer (age of onset: 94) in her brother; Cancer (age of onset: 69) in her sister; Diabetes in her father and sister; Healthy in her son; Kidney failure in her sister; Macular degeneration in her sister; Tremor in her sister.    ROS:  Please see the history of present illness.   Otherwise, review of systems are positive for none.   All other systems are reviewed and negative.    PHYSICAL EXAM: VS:  BP 132/70   Pulse 88   Ht 4\' 11"  (1.499 m)   Wt 176 lb 12.8 oz (80.2 kg)   SpO2 98%   BMI 35.71 kg/m  , BMI Body mass index is 35.71 kg/m. GEN: Well nourished, well developed, in no acute distress  HEENT: normal  Neck: no JVD, carotid bruits, or masses Cardiac: RRR; no murmurs, rubs, or gallops.  No edema Respiratory:  clear to auscultation  bilaterally, normal work of breathing GI: soft, nontender, nondistended, + BS MS: no deformity or atrophy  Skin: warm and dry, no rash Neuro:  Strength and sensation are intact Psych: euthymic mood, full affect She has palpable distal pulses.  EKG:  EKG is not ordered today.    Recent Labs: 08/17/2023: BUN 8; Creatinine, Ser 0.83; Hemoglobin 14.6; Platelets 393; Potassium 4.1; Sodium 139    Lipid Panel    Component Value Date/Time  CHOL 207 (H) 03/31/2015 1055   TRIG 383.0 (H) 03/31/2015 1055   HDL 37.70 (L) 03/31/2015 1055   CHOLHDL 5 03/31/2015 1055   VLDL 76.6 (H) 03/31/2015 1055   LDLDIRECT 101.0 03/31/2015 1055      Wt Readings from Last 3 Encounters:  10/30/23 176 lb 12.8 oz (80.2 kg)  10/10/23 175 lb (79.4 kg)  09/23/23 180 lb 12.8 oz (82 kg)          No data to display            ASSESSMENT AND PLAN:  1.  Palpitations: Likely PACs and PVCs.  Symptoms are well controlled with Toprol 50 mg once a day  2.  Essential hypertension: Blood pressure is well controlled on current medications.  3.  Recent chest pain: Likely noncardiac.  Lexiscan Myoview was normal.  No recurrent chest pain  4.  Hyperlipidemia: Most recent lipid profile in January showed an LDL of 110 and triglyceride of 218.  Most recent hemoglobin A1c was 7.9.  Given that she is diabetic, recommend a target LDL of less than 70. She is very hesitant about going on a statin due to concerns about developing myalgia.  I discussed the rationale for treatment especially in the setting of aortic atherosclerosis and diabetic status.  We agreed to start small dose rosuvastatin 5 mg once daily.  Recheck lipid and liver profile in 6 months.    Disposition:   FU in 6 months.  Signed,  Lorine Bears, MD  10/30/2023 8:21 AM    [facility]

## 2023-10-30 NOTE — Patient Instructions (Signed)
 Medication Instructions:  START Rosuvastatin 5 mg once daily   *If you need a refill on your cardiac medications before your next appointment, please call your pharmacy*   Lab Work: Your provider would like for you to return in 2 months to have the following labs drawn: Fasting Lipid and LIver.   Please go to [facility] [address] ([facility] [address], [place] [address] You do not need an appointment.  They are open from 8 am- 4:30 pm.  Lunch from 1:00 pm- 2:00 pm You will need to be fasting.   You may also go to one of the following [facility]:  [address] [place], [place] [address] Phone: [phone] Lab hours: Mon-Fri 8 am- 5 pm    Lunch 12 pm- 1 pm  [address] [place],  [place]  [address]  [place] Phone: [phone] Lab hours: 7 am- 4 pm Lunch 12 pm-1 pm   [address] [place],  [place]  [address]  [place] Phone: [phone] Lab hours: Mon-Fri 8 am- 5 pm    Lunch 12 pm- 1 pm  If you have labs (blood work) drawn today and your tests are completely normal, you will receive your results only by: MyChart Message (if you have MyChart) OR A paper copy in the mail If you have any lab test that is abnormal or we need to change your treatment, we will call you to review the results.   Testing/Procedures: None ordered   Follow-Up: At [facility], you and your health needs are our priority.  As part of our continuing mission to provide you with exceptional heart care, we have created designated Provider Care Teams.  These Care Teams include your primary Cardiologist (physician) and Advanced Practice Providers (APPs -  Physician Assistants and Nurse Practitioners) who all work together to provide you with the care you need, when you need it.  We recommend signing up for the patient portal called "MyChart".  Sign up information is provided on this After Visit Summary.  MyChart is used to connect with patients for Virtual Visits (Telemedicine).   Patients are able to view lab/test results, encounter notes, upcoming appointments, etc.  Non-urgent messages can be sent to your provider as well.   To learn more about what you can do with MyChart, go to [email or web address].    Your next appointment:   6 month(s)  Provider:   You may see Lorine Bears, MD or one of the following Advanced Practice Providers on your designated Care Team:   Nicolasa Ducking, NP Eula Listen, PA-C Cadence Fransico Michael, PA-C Charlsie Quest, NP Carlos Levering, NP

## 2023-12-03 DIAGNOSIS — E1169 Type 2 diabetes mellitus with other specified complication: Secondary | ICD-10-CM | POA: Diagnosis not present

## 2023-12-03 DIAGNOSIS — E782 Mixed hyperlipidemia: Secondary | ICD-10-CM | POA: Diagnosis not present

## 2023-12-03 DIAGNOSIS — I10 Essential (primary) hypertension: Secondary | ICD-10-CM | POA: Diagnosis not present

## 2023-12-25 ENCOUNTER — Other Ambulatory Visit
Admission: RE | Admit: 2023-12-25 | Discharge: 2023-12-25 | Disposition: A | Discharge status: Home / Self Care | Attending: Cardiovascular Disease | Admitting: Cardiovascular Disease

## 2023-12-25 DIAGNOSIS — E785 Hyperlipidemia, unspecified: Secondary | ICD-10-CM | POA: Diagnosis not present

## 2023-12-25 LAB — HEPATIC FUNCTION PANEL
ALT: 47 U/L — ABNORMAL HIGH (ref 0–44)
AST: 37 U/L (ref 15–41)
Albumin: 4.2 g/dL (ref 3.5–5.0)
Alkaline Phosphatase: 66 U/L (ref 38–126)
Bilirubin, Direct: 0.1 mg/dL (ref 0.0–0.2)
Indirect Bilirubin: 0.5 mg/dL (ref 0.3–0.9)
Total Bilirubin: 0.6 mg/dL (ref 0.0–1.2)
Total Protein: 7.7 g/dL (ref 6.5–8.1)

## 2023-12-25 LAB — LIPID PANEL
Cholesterol: 98 mg/dL (ref 0–200)
HDL: 34 mg/dL — ABNORMAL LOW (ref >40)
LDL Cholesterol: 32 mg/dL (ref 0–99)
Total CHOL/HDL Ratio: 2.9 ratio
Triglycerides: 159 mg/dL — ABNORMAL HIGH (ref <150)
VLDL: 32 mg/dL (ref 0–40)

## 2023-12-30 ENCOUNTER — Ambulatory Visit: Payer: Self-pay | Admitting: *Deleted

## 2023-12-31 ENCOUNTER — Encounter: Payer: Self-pay | Admitting: Medical

## 2023-12-31 ENCOUNTER — Ambulatory Visit: Attending: Medical | Admitting: Medical

## 2023-12-31 VITALS — BP 118/73 | HR 75 | Ht <= 58 in | Wt 172.0 lb

## 2023-12-31 DIAGNOSIS — E782 Mixed hyperlipidemia: Secondary | ICD-10-CM | POA: Diagnosis not present

## 2023-12-31 DIAGNOSIS — I10 Essential (primary) hypertension: Secondary | ICD-10-CM

## 2023-12-31 DIAGNOSIS — R002 Palpitations: Secondary | ICD-10-CM

## 2023-12-31 MED ORDER — FUROSEMIDE 20 MG PO TABS: 20.0000 mg | ORAL_TABLET | Freq: Every day | ORAL | 0 refills | Status: AC | PRN

## 2023-12-31 NOTE — Patient Instructions (Signed)
 Medication Instructions:  The current medical regimen is effective;  continue present plan and medications as directed. Please refer to the Current Medication list given to you today.   *If you need a refill on your cardiac medications before your next appointment, please call your pharmacy*  Lab Work: Your provider would like for you to have following labs drawn today LFT (before appointment in 3 months).   If you have labs (blood work) drawn today and your tests are completely normal, you will receive your results only by: MyChart Message (if you have MyChart) OR A paper copy in the mail If you have any lab test that is abnormal or we need to change your treatment, we will call you to review the results.  Follow-Up: At [facility], you and your health needs are our priority.  As part of our continuing mission to provide you with exceptional heart care, our providers are all part of one team.  This team includes your primary Cardiologist (physician) and Advanced Practice Providers or APPs (Physician Assistants and Nurse Practitioners) who all work together to provide you with the care you need, when you need it.  Your next appointment:   3 month(s)  Provider:   Antionette Kirks, MD or Cadence Gennaro Khat, PA-C

## 2023-12-31 NOTE — Progress Notes (Signed)
 Cardiology Office Note:  .   Date:  12/31/2023  ID:  Nicole Garcia, DOB March 12, 1946, MRN [id number] PCP: Sari Cunning, MD  [facility] Cardiologist:  Antionette Kirks, MD {  History of Present Illness: Nicole Garcia   Nicole Garcia is a 78 y.o. female with a h/o palpitations, HLD, hypertension, diabetes type 2, GERD who presents for follow-up.   Patient was seen in 2016 for atypical chest pain and shortness of breath.  A treadmill nuclear stress test at that time showed no evidence of ischemia with normal ejection fraction.  Echo was overall unremarkable except for mild diastolic dysfunction.   Patient takes amlodipine  and Toprol  for elevated blood pressure.  Beta-blocker has been increased due to palpitations.  Heart monitor showed sinus rhythm with rare PACs and PVCs.   Patient was seen December 2024 for ER follow-up for chest pain, ruled out ACS.  Toprol  was increased and amlodipine  was restarted.  Myoview  ETT was ordered.  Stress test turned into Lexi scan, this was a normal study and low risk with normal perfusion and no evidence of ischemia.  CT images showed mild aortic calcifications but no coronary calcification.  The patient was last seen 10/30/23 and was stable from a cardiac perspective. She was started on crestor . Follow-up cholesterol was much improved, ST was mildly up.  Today, the patient reports she is overall doing OK. Cholesterol is much better. She was started on monjouro for diabetes and lost 8 lbs. She denies chest pain and palpitations.    Studies Reviewed: .        Myoview  08/2023   The study is normal. The study is low risk.   No ST deviation was noted.   LV perfusion is normal. There is no evidence of ischemia. There is no evidence of infarction.   Left ventricular function is normal. Nuclear stress EF: 59%. End diastolic cavity size is normal. End systolic cavity size is normal.   CT attenuation images showed mild aortic calcifications but no coronary  calcifications.  Heart monitor 11/2021 Patch Wear Time:  6 days and 23 hours (2023-03-31T17:36:17-0400 to 2023-04-07T17:06:08-398)   Patient had a min HR of 61 bpm, max HR of 128 bpm, and avg HR of 88 bpm. Predominant underlying rhythm was Sinus Rhythm. Rare PACs and rare PVCs.  No significant arrhythmia overall.   ETT 08/2017 Blood pressure demonstrated a hypertensive response to exercise. There was no ST segment deviation noted during stress. No T wave inversion was noted during stress.   Normal treadmill stress test with no evidence of ischemia. Poor exercise capacity with hypertensive response to exercise. Exercise duration was 3 minutes and 22 seconds correlating with 5 Mets. Peak blood pressure was 191/84   Echo 08/2017 Study Conclusions   - Left ventricle: The cavity size was normal. Systolic function was    normal. The estimated ejection fraction was in the range of 60%    to 65%. Wall motion was normal; there were no regional wall    motion abnormalities. Doppler parameters are consistent with    abnormal left ventricular relaxation (grade 1 diastolic    dysfunction).  - Left atrium: The atrium was normal in size.  - Right ventricle: Systolic function was normal.  - Pulmonary arteries: Systolic pressure was within the normal    range.    Physical Exam:   VS:  BP 118/73 (BP Location: Left Arm)   Pulse 75   Ht 4\' 10"  (1.473 m)   Wt 172  lb (78 kg)   SpO2 94%   BMI 35.95 kg/m    Wt Readings from Last 3 Encounters:  12/31/23 172 lb (78 kg)  10/30/23 176 lb 12.8 oz (80.2 kg)  10/10/23 175 lb (79.4 kg)    GEN: Well nourished, well developed in no acute distress NECK: No JVD; No carotid bruits CARDIAC: RRR, no murmurs, rubs, gallops RESPIRATORY:  Clear to auscultation without rales, wheezing or rhonchi  ABDOMEN: Soft, non-tender, non-distended EXTREMITIES:  No edema; No deformity   ASSESSMENT AND PLAN: .    Palpitations No significant symptoms. Continue Toprol   50mg  daily.   HTN BP is well controlled on current medications. Says BP is improving with weight loss.   HLD Cholesterol has improved on low dose Crestor . Total chol 98, TG 159, HDL 34, LDL 32. AST 47. Can re-check LFTs in 3 months. Continue Crestor  5mg  daily.        Dispo: Follow-up in 3 months  Signed, Joud Ingwersen Rebekah Canada, PA-C

## 2024-02-04 DIAGNOSIS — D2261 Melanocytic nevi of right upper limb, including shoulder: Secondary | ICD-10-CM | POA: Diagnosis not present

## 2024-02-04 DIAGNOSIS — D225 Melanocytic nevi of trunk: Secondary | ICD-10-CM | POA: Diagnosis not present

## 2024-02-04 DIAGNOSIS — D2271 Melanocytic nevi of right lower limb, including hip: Secondary | ICD-10-CM | POA: Diagnosis not present

## 2024-02-04 DIAGNOSIS — L821 Other seborrheic keratosis: Secondary | ICD-10-CM | POA: Diagnosis not present

## 2024-02-04 DIAGNOSIS — L400 Psoriasis vulgaris: Secondary | ICD-10-CM | POA: Diagnosis not present

## 2024-02-04 DIAGNOSIS — D2272 Melanocytic nevi of left lower limb, including hip: Secondary | ICD-10-CM | POA: Diagnosis not present

## 2024-02-04 DIAGNOSIS — D2262 Melanocytic nevi of left upper limb, including shoulder: Secondary | ICD-10-CM | POA: Diagnosis not present

## 2024-02-11 DIAGNOSIS — E119 Type 2 diabetes mellitus without complications: Secondary | ICD-10-CM | POA: Diagnosis not present

## 2024-02-11 DIAGNOSIS — H524 Presbyopia: Secondary | ICD-10-CM | POA: Diagnosis not present

## 2024-02-11 DIAGNOSIS — Z961 Presence of intraocular lens: Secondary | ICD-10-CM | POA: Diagnosis not present

## 2024-02-17 DIAGNOSIS — E782 Mixed hyperlipidemia: Secondary | ICD-10-CM | POA: Diagnosis not present

## 2024-02-17 DIAGNOSIS — E1169 Type 2 diabetes mellitus with other specified complication: Secondary | ICD-10-CM | POA: Diagnosis not present

## 2024-02-24 DIAGNOSIS — Z79899 Other long term (current) drug therapy: Secondary | ICD-10-CM | POA: Diagnosis not present

## 2024-02-24 DIAGNOSIS — E538 Deficiency of other specified B group vitamins: Secondary | ICD-10-CM | POA: Diagnosis not present

## 2024-02-24 DIAGNOSIS — E1169 Type 2 diabetes mellitus with other specified complication: Secondary | ICD-10-CM | POA: Diagnosis not present

## 2024-02-24 DIAGNOSIS — E782 Mixed hyperlipidemia: Secondary | ICD-10-CM | POA: Diagnosis not present

## 2024-03-02 DIAGNOSIS — K600 Acute anal fissure: Secondary | ICD-10-CM | POA: Diagnosis not present

## 2024-03-02 DIAGNOSIS — K219 Gastro-esophageal reflux disease without esophagitis: Secondary | ICD-10-CM | POA: Diagnosis not present

## 2024-03-02 DIAGNOSIS — Z860100 Personal history of colon polyps, unspecified: Secondary | ICD-10-CM | POA: Diagnosis not present

## 2024-03-02 DIAGNOSIS — K6289 Other specified diseases of anus and rectum: Secondary | ICD-10-CM | POA: Diagnosis not present

## 2024-03-02 DIAGNOSIS — R194 Change in bowel habit: Secondary | ICD-10-CM | POA: Diagnosis not present

## 2024-03-09 ENCOUNTER — Other Ambulatory Visit: Payer: Self-pay | Admitting: Internal Medicine

## 2024-03-09 DIAGNOSIS — Z1231 Encounter for screening mammogram for malignant neoplasm of breast: Secondary | ICD-10-CM

## 2024-03-26 ENCOUNTER — Telehealth: Payer: Self-pay | Admitting: Cardiovascular Disease

## 2024-03-26 NOTE — Telephone Encounter (Signed)
 Patient has been made aware and will address it at her follow up on 04/09/24.

## 2024-03-26 NOTE — Telephone Encounter (Signed)
 Hold Crestor  for 2 weeks to see if there is improvement in symptoms.

## 2024-03-26 NOTE — Telephone Encounter (Signed)
 Pt c/o medication issue:  1. Name of Medication:   rosuvastatin  (CRESTOR ) 5 MG tablet (Expired)    2. How are you currently taking this medication (dosage and times per day)?    3. Are you having a reaction (difficulty breathing--STAT)? no  4. What is your medication issue? Patient states that she would like to stop taking this medication. To see if it will help with her weakness. Please advise

## 2024-03-26 NOTE — Telephone Encounter (Signed)
 Left a message for the patient to call back.

## 2024-03-26 NOTE — Telephone Encounter (Signed)
 Called patient, she states that she has been having some cramping in her leg and her hand. She states she does have some weakness as well, she did want to see about holding the medication to see if it improved the symptoms.   She did not take it yesterday or today.   Patient advised I would send a message back for review and would call back with recommendations.

## 2024-03-26 NOTE — Telephone Encounter (Signed)
 Patient returning call.

## 2024-04-08 DIAGNOSIS — E785 Hyperlipidemia, unspecified: Secondary | ICD-10-CM | POA: Diagnosis not present

## 2024-04-09 ENCOUNTER — Ambulatory Visit: Attending: Cardiovascular Disease | Admitting: Cardiovascular Disease

## 2024-04-09 ENCOUNTER — Ambulatory Visit: Payer: Self-pay | Admitting: Cardiovascular Disease

## 2024-04-09 ENCOUNTER — Encounter: Payer: Self-pay | Admitting: Cardiovascular Disease

## 2024-04-09 VITALS — BP 120/60 | HR 81 | Ht <= 58 in | Wt 171.0 lb

## 2024-04-09 DIAGNOSIS — I10 Essential (primary) hypertension: Secondary | ICD-10-CM

## 2024-04-09 DIAGNOSIS — R002 Palpitations: Secondary | ICD-10-CM | POA: Diagnosis not present

## 2024-04-09 DIAGNOSIS — E782 Mixed hyperlipidemia: Secondary | ICD-10-CM

## 2024-04-09 LAB — HEPATIC FUNCTION PANEL
ALT: 18 IU/L (ref 0–32)
AST: 20 IU/L (ref 0–40)
Albumin: 4.5 g/dL (ref 3.8–4.8)
Alkaline Phosphatase: 99 IU/L (ref 44–121)
Bilirubin Total: 0.3 mg/dL (ref 0.0–1.2)
Bilirubin, Direct: 0.11 mg/dL (ref 0.00–0.40)
Total Protein: 7.3 g/dL (ref 6.0–8.5)

## 2024-04-09 LAB — LIPID PANEL
Chol/HDL Ratio: 5.2 ratio — ABNORMAL HIGH (ref 0.0–4.4)
Cholesterol, Total: 198 mg/dL (ref 100–199)
HDL: 38 mg/dL — ABNORMAL LOW (ref >39)
LDL Chol Calc (NIH): 103 mg/dL — ABNORMAL HIGH (ref 0–99)
Triglycerides: 338 mg/dL — ABNORMAL HIGH (ref 0–149)
VLDL Cholesterol Cal: 57 mg/dL — ABNORMAL HIGH (ref 5–40)

## 2024-04-09 MED ORDER — ROSUVASTATIN CALCIUM 5 MG PO TABS: 5.0000 mg | ORAL_TABLET | ORAL | 3 refills | Status: DC

## 2024-04-09 NOTE — Progress Notes (Signed)
 Cardiology Office Note   Date:  04/09/2024   ID:  [name], [name] 1945-12-10, MRN [id number]  PCP:  Cleotilde Oneil FALCON, MD  Cardiologist:   Deatrice Cage, MD   Chief Complaint  Patient presents with   Follow-up    3 Month f/u c/o Crestor  causing left hand numbness and right leg weakness pt feels like it may be causing these issues Pt holding since last week. Meds reviewed verbally with pt.      History of Present Illness: Nicole Garcia is a 78 y.o. female who presents for a follow-up visit regarding palpitations and hypertension.   She has known history of hypertension, type 2 diabetes and GERD. She was seen by me in 2016 for atypical chest pain and shortness of breath. A treadmill nuclear stress test in January 2016  showed no evidence of ischemia with normal ejection fraction. Echocardiogram was overall unremarkable except for mild diastolic dysfunction.   Unfortunately, her husband died in 02-Aug-2022due to GI bleed.  She had significant anxiety and depression since then.  She had elevated blood pressure and palpitations after that.   She underwent 1 week outpatient monitor in 2023 which showed sinus rhythm with rare PACs and rare PVCs.  No significant arrhythmia.  She had a Lexiscan  Myoview  in January 2025 which showed no evidence of ischemia with normal ejection fraction.  CT attenuation images showed mild aortic calcifications with no coronary calcifications noted.  Due to hyperlipidemia and diabetic status, she was started on small dose rosuvastatin  in March.  She stopped the medication 2 weeks ago due to left hand discomfort and right leg pain.  Symptoms improved but did not resolve.  Otherwise she has been doing well with no chest pain or worsening dyspnea.  She has exertional palpitations.  She does not exercise on a regular basis.  Past Medical History:  Diagnosis Date   Arthritis    Atypical chest pain    a. 08/2014 Ex MV: EF 70%, no ischemia/infarct; b. 08/2017  ETT: Ex time 3:22. HTN response. No ST/T changes.   Concussion    Depression    Diabetes mellitus without complication (HCC)    Diastolic dysfunction    a. 08/2017 Echo: EF 60%, no rwma, GrI DD, nl RV fxn.   Diverticulitis    GERD (gastroesophageal reflux disease)    History of chicken pox    Hyperglycemia    Hypertension    Physical deconditioning    a. 08/2017 ETT: Ex time 3:22.    Past Surgical History:  Procedure Laterality Date   ABDOMINAL HYSTERECTOMY     BLADDER SUSPENSION  1997   microinvasive Protogen   BREAST EXCISIONAL BIOPSY Left    BREAST EXCISIONAL BIOPSY Right    BREAST SURGERY     biopsy 1966 and 2000   CATARACT EXTRACTION, BILATERAL     CHOLECYSTECTOMY     ROOT CANAL     TOE SURGERY     VAGINAL DELIVERY     x2     Current Outpatient Medications  Medication Sig Dispense Refill   Acetaminophen  (TYLENOL  PO) Take by mouth. Am and pm as needed.     ALPRAZolam (XANAX) 1 MG tablet Take 1 mg by mouth at bedtime as needed for sleep.     cyanocobalamin  (VITAMIN B12) 1000 MCG tablet Take 1,000 mcg by mouth daily.     dicyclomine (BENTYL) 20 MG tablet Take 20 mg by mouth daily as needed for spasms.  furosemide  (LASIX ) 20 MG tablet Take 1 tablet (20 mg total) by mouth daily as needed. 60 tablet 0   glimepiride (AMARYL) 1 MG tablet Take 1 mg by mouth. 3 times per day     JANUVIA 25 MG tablet Take 25 mg by mouth daily.     metoprolol  succinate (TOPROL -XL) 50 MG 24 hr tablet Take 1 tablet (50 mg total) by mouth daily. Due yearly visit.  PLEASE CALL OFFICE TO SCHEDULE APPOINTMENT PRIOR TO NEXT REFILL (first attempt) 90 tablet 3   MOUNJARO 2.5 MG/0.5ML Pen once a week. Every friday     omeprazole  (PRILOSEC) 20 MG capsule Take 20 mg by mouth daily.     pantoprazole (PROTONIX) 40 MG tablet Take 40 mg by mouth daily.     Probiotic Product (PROBIOTIC PO) Take 1 tablet by mouth daily.     senna-docusate (SENOKOT-S) 8.6-50 MG tablet Take 1 tablet by mouth at bedtime. (Patient  taking differently: Take 1 tablet by mouth 2 (two) times daily.) 30 tablet 0   amLODipine  (NORVASC ) 2.5 MG tablet Take 1 tablet (2.5 mg total) by mouth daily. (Patient not taking: Reported on 04/09/2024) 90 tablet 3   hydrocortisone  (ANUSOL -HC) 25 MG suppository Place 1 suppository (25 mg total) rectally 2 (two) times daily. (Patient not taking: Reported on 04/09/2024) 12 suppository 0   Omega-3 Fatty Acids (FISH OIL PO) Take by mouth 2 (two) times daily at 10 AM and 5 PM. (Patient not taking: Reported on 04/09/2024)     penicillin v potassium (VEETID) 500 MG tablet Take 500 mg by mouth every 8 (eight) hours. (Patient not taking: Reported on 04/09/2024)     rosuvastatin  (CRESTOR ) 5 MG tablet Take 1 tablet (5 mg total) by mouth daily. (Patient not taking: Reported on 04/09/2024) 90 tablet 3   No current facility-administered medications for this visit.    Allergies:   Iohexol, 5-alpha reductase inhibitors, Bactroban [mupirocin calcium ], Ciprofloxacin, Codeine, Levofloxacin, Linaclotide, Metformin and related, Nitrofurantoin, and Sulfonamide derivatives    Social History:  The patient  reports that she has never smoked. She has never used smokeless tobacco. She reports that she does not drink alcohol and does not use drugs.   Family History:  The patient's family history includes Cancer in her sister; Cancer (age of onset: 48) in her brother; Cancer (age of onset: 60) in her sister; Diabetes in her father and sister; Healthy in her son; Kidney failure in her sister; Macular degeneration in her sister; Tremor in her sister.    ROS:  Please see the history of present illness.   Otherwise, review of systems are positive for none.   All other systems are reviewed and negative.    PHYSICAL EXAM: VS:  BP 120/60 (BP Location: Left Arm, Patient Position: Sitting, Cuff Size: Large)   Pulse 81   Ht 4' 10 (1.473 m)   Wt 171 lb (77.6 kg)   SpO2 98%   BMI 35.74 kg/m  , BMI Body mass index is 35.74  kg/m. GEN: Well nourished, well developed, in no acute distress  HEENT: normal  Neck: no JVD, carotid bruits, or masses Cardiac: RRR; no murmurs, rubs, or gallops.  No edema Respiratory:  clear to auscultation bilaterally, normal work of breathing GI: soft, nontender, nondistended, + BS MS: no deformity or atrophy  Skin: warm and dry, no rash Neuro:  Strength and sensation are intact Psych: euthymic mood, full affect She has palpable distal pulses.  EKG:  EKG is ordered today.  Normal  sinus rhythm Low voltage QRS Inferior infarct (cited on or before 18-Aug-2023) When compared with ECG of 23-Sep-2023 13:50, No significant change was found     Recent Labs: 08/17/2023: BUN 8; Creatinine, Ser 0.83; Hemoglobin 14.6; Platelets 393; Potassium 4.1; Sodium 139 04/08/2024: ALT 18    Lipid Panel    Component Value Date/Time   CHOL 198 04/08/2024 0803   TRIG 338 (H) 04/08/2024 0803   HDL 38 (L) 04/08/2024 0803   CHOLHDL 5.2 (H) 04/08/2024 0803   CHOLHDL 2.9 12/25/2023 0758   VLDL 32 12/25/2023 0758   LDLCALC 103 (H) 04/08/2024 0803   LDLDIRECT 101.0 03/31/2015 1055      Wt Readings from Last 3 Encounters:  04/09/24 171 lb (77.6 kg)  12/31/23 172 lb (78 kg)  10/30/23 176 lb 12.8 oz (80.2 kg)          No data to display            ASSESSMENT AND PLAN:  1.  Palpitations: Likely PACs and PVCs.  Symptoms are well controlled with Toprol  50 mg once a day.  I suspect that she also has cardiac deconditioning.  I discussed with her the importance of starting a regular exercise program.  2.  Essential hypertension: Blood pressure is well controlled on current medications.  3.  Mixed hyperlipidemia: She had significant improvement with rosuvastatin  5 mg once daily.  Lipid profile yesterday showed worsening with an LDL of 103 even though she stopped rosuvastatin  only recently.  Again, given that she is diabetic, I recommend that we try to get her LDL below 70.  She agreed to  resume rosuvastatin  at a dose of 5 mg every other day to see if she can tolerate.    Disposition:   FU in 12 months.  Signed,  Deatrice Cage, MD  04/09/2024 9:53 AM    [facility]

## 2024-04-09 NOTE — Patient Instructions (Addendum)
 Medication Instructions:  CHANGE: your Rosuvastatin  to 5 mg every other day  *If you need a refill on your cardiac medications before your next appointment, please call your pharmacy*  Lab Work: None ordered If you have labs (blood work) drawn today and your tests are completely normal, you will receive your results only by: MyChart Message (if you have MyChart) OR A paper copy in the mail If you have any lab test that is abnormal or we need to change your treatment, we will call you to review the results.  Testing/Procedures: None ordered  Follow-Up: At [facility], you and your health needs are our priority.  As part of our continuing mission to provide you with exceptional heart care, our providers are all part of one team.  This team includes your primary Cardiologist (physician) and Advanced Practice Providers or APPs (Physician Assistants and Nurse Practitioners) who all work together to provide you with the care you need, when you need it.  Your next appointment:   12 month(s)  Provider:   You may see Deatrice Cage, MD or one of the following Advanced Practice Providers on your designated Care Team:   Lonni Meager, NP Lesley Maffucci, PA-C Bernardino Bring, PA-C Cadence [place], PA-C Tylene Lunch, NP Barnie Hila, NP    We recommend signing up for the patient portal called MyChart.  Sign up information is provided on this After Visit Summary.  MyChart is used to connect with patients for Virtual Visits (Telemedicine).  Patients are able to view lab/test results, encounter notes, upcoming appointments, etc.  Non-urgent messages can be sent to your provider as well.   To learn more about what you can do with MyChart, go to [email or web address].

## 2024-04-23 ENCOUNTER — Ambulatory Visit
Admission: RE | Admit: 2024-04-23 | Discharge: 2024-04-23 | Disposition: A | Discharge status: Home / Self Care | Source: Ambulatory Visit | Attending: Internal Medicine | Admitting: Internal Medicine

## 2024-04-23 DIAGNOSIS — Z1231 Encounter for screening mammogram for malignant neoplasm of breast: Secondary | ICD-10-CM

## 2024-05-05 DIAGNOSIS — M26621 Arthralgia of right temporomandibular joint: Secondary | ICD-10-CM | POA: Diagnosis not present

## 2024-05-05 DIAGNOSIS — H9201 Otalgia, right ear: Secondary | ICD-10-CM | POA: Diagnosis not present

## 2024-05-17 ENCOUNTER — Telehealth: Payer: Self-pay | Admitting: Cardiovascular Disease

## 2024-05-17 NOTE — Telephone Encounter (Signed)
 Pt c/o medication issue:  1. Name of Medication:   rosuvastatin  (CRESTOR ) 5 MG tablet   2. How are you currently taking this medication (dosage and times per day)?   Take 1 tablet (5 mg total) by mouth every other day.    3. Are you having a reaction (difficulty breathing--STAT)? No  4. What is your medication issue? Pt is experiencing severe muscle weakness in her legs. She is nervous about falling. She is going to discontinue CRESTOR . Please advise.

## 2024-05-17 NOTE — Telephone Encounter (Signed)
 Stop Crestor .  We can try Zetia 10 mg daily as an alternative.

## 2024-05-17 NOTE — Telephone Encounter (Signed)
 The patient called today stating she has stopped taking Crestor  (rosuvastatin ) completely due to persistent and worsening muscle weakness.  At her last visit, Dr. Darron advised her to take Crestor  5 mg every other day for her reported muscle weakness.  Current Symptoms:  She reports her general muscle weakness persists despite the dose change.  NEW/ACUTE CONCERN: She states that Saturday night, after sitting and painting, she was unable to stand on her right leg for a period of time.  She states she had to hold on to furniture to prevent herself from falling.  She states that she went to bed and the next morning she could bear weight, but continued to feel weak.  She also reports weakness in her right arm and hand.  I informed pt that I will forward her concerns to Dr. Darron and once he gives instructions for the next steps a nurse from our office will give her a call back with Dr. Renato advice.  Pt thanked me for the phone call.

## 2024-05-18 MED ORDER — EZETIMIBE 10 MG PO TABS: 10.0000 mg | ORAL_TABLET | Freq: Every day | ORAL | 3 refills | Status: AC

## 2024-05-18 NOTE — Telephone Encounter (Signed)
 Patient has been made aware and will try the Zetia. This has been sent in for her.

## 2024-05-21 DIAGNOSIS — E782 Mixed hyperlipidemia: Secondary | ICD-10-CM | POA: Diagnosis not present

## 2024-05-21 DIAGNOSIS — G5601 Carpal tunnel syndrome, right upper limb: Secondary | ICD-10-CM | POA: Diagnosis not present

## 2024-05-21 DIAGNOSIS — E1169 Type 2 diabetes mellitus with other specified complication: Secondary | ICD-10-CM | POA: Diagnosis not present

## 2024-05-21 DIAGNOSIS — M5116 Intervertebral disc disorders with radiculopathy, lumbar region: Secondary | ICD-10-CM | POA: Diagnosis not present

## 2024-06-04 DIAGNOSIS — M5116 Intervertebral disc disorders with radiculopathy, lumbar region: Secondary | ICD-10-CM | POA: Diagnosis not present

## 2024-06-04 DIAGNOSIS — E782 Mixed hyperlipidemia: Secondary | ICD-10-CM | POA: Diagnosis not present

## 2024-06-04 DIAGNOSIS — E1169 Type 2 diabetes mellitus with other specified complication: Secondary | ICD-10-CM | POA: Diagnosis not present

## 2024-06-09 ENCOUNTER — Telehealth: Payer: Self-pay | Admitting: Cardiovascular Disease

## 2024-06-09 NOTE — Telephone Encounter (Signed)
 Called and spoke with the patient to inform her that according to Dr. Darron, she can stop her Zetia.  Patient verbalized understanding with all questions and concerns addressed at this time.

## 2024-06-09 NOTE — Telephone Encounter (Signed)
 Called and spoke with the patient regarding foot pain symptoms with Zetia.  Message will be forwarded to Dr. Darron for review and recommendations.  Informed the patient that, according to our records, she is supposed to take Metoprolol  Succinate 50 mg once daily.  Patient verbalized understanding with all questions and concerns addressed at this time.

## 2024-06-09 NOTE — Telephone Encounter (Signed)
 Pt c/o medication issue:  1. Name of Medication: ezetimibe (ZETIA) 10 MG tablet   metoprolol  succinate (TOPROL -XL) 50 MG 24 hr tablet    2. How are you currently taking this medication (dosage and times per day)?  Take 1 tablet (10 mg total) by mouth daily.     Take 1 tablet (50 mg total) by mouth daily. Due yearly visit. PLEASE CALL OFFICE TO SCHEDULE APPOINTMENT PRIOR TO NEXT REFILL (first attempt)      3. Are you having a reaction (difficulty breathing--STAT)? No  4. What is your medication issue? Pt stated the zetia was causing her to have major discomfort in her feet to the point of her needing a walker and since she's stopped the medication for 5 days she's been fine. She'd also like to know if she should be taking 25mg  or 50mg  of the metoprolol . Please advise

## 2024-06-09 NOTE — Telephone Encounter (Signed)
 Ok to stop Zetia.

## 2024-06-15 DIAGNOSIS — R200 Anesthesia of skin: Secondary | ICD-10-CM | POA: Diagnosis not present

## 2024-06-15 DIAGNOSIS — E569 Vitamin deficiency, unspecified: Secondary | ICD-10-CM | POA: Diagnosis not present

## 2024-07-13 ENCOUNTER — Other Ambulatory Visit: Payer: Self-pay | Admitting: Medical

## 2024-07-13 DIAGNOSIS — I10 Essential (primary) hypertension: Secondary | ICD-10-CM

## 2024-07-13 DIAGNOSIS — E782 Mixed hyperlipidemia: Secondary | ICD-10-CM

## 2024-07-13 DIAGNOSIS — R002 Palpitations: Secondary | ICD-10-CM

## 2024-07-26 DIAGNOSIS — E782 Mixed hyperlipidemia: Secondary | ICD-10-CM | POA: Diagnosis not present

## 2024-07-26 DIAGNOSIS — E1169 Type 2 diabetes mellitus with other specified complication: Secondary | ICD-10-CM | POA: Diagnosis not present

## 2024-07-26 DIAGNOSIS — M7582 Other shoulder lesions, left shoulder: Secondary | ICD-10-CM | POA: Diagnosis not present

## 2024-08-02 ENCOUNTER — Other Ambulatory Visit: Payer: Self-pay | Admitting: Physician Assistant

## 2024-08-02 DIAGNOSIS — M7582 Other shoulder lesions, left shoulder: Secondary | ICD-10-CM

## 2024-08-06 ENCOUNTER — Ambulatory Visit
Admission: RE | Admit: 2024-08-06 | Discharge: 2024-08-06 | Disposition: A | Discharge status: Home / Self Care | Source: Ambulatory Visit | Attending: Physician Assistant | Admitting: Physician Assistant

## 2024-08-06 DIAGNOSIS — M7582 Other shoulder lesions, left shoulder: Secondary | ICD-10-CM | POA: Insufficient documentation

## 2024-08-24 NOTE — Progress Notes (Signed)
 Nicole Garcia                                          MRN: [id number]   08/24/2024   The [facility] Quality Team Specialist reviewed this patient medical record for the purposes of chart review for care gap closure. The following were reviewed: chart review for care gap closure-controlling blood pressure.    [facility] Quality Team
